# Patient Record
Sex: Female | Born: 1955 | Race: Black or African American | Hispanic: No | Marital: Single | Smoking: Never smoker
Health system: Southern US, Community
[De-identification: ages and names within clinical notes are randomized; demographics above are authoritative.]

## PROBLEM LIST (undated history)

## (undated) DIAGNOSIS — I10 Essential (primary) hypertension: Secondary | ICD-10-CM

## (undated) DIAGNOSIS — E119 Type 2 diabetes mellitus without complications: Secondary | ICD-10-CM

---

## 2019-04-28 ENCOUNTER — Emergency Department (HOSPITAL_COMMUNITY): Payer: Self-pay

## 2019-04-28 ENCOUNTER — Emergency Department (HOSPITAL_COMMUNITY)
Admission: EM | Admit: 2019-04-28 | Discharge: 2019-04-28 | Disposition: A | Discharge status: Home / Self Care | Payer: Self-pay | Attending: Emergency Medicine | Admitting: Emergency Medicine

## 2019-04-28 ENCOUNTER — Other Ambulatory Visit: Payer: Self-pay

## 2019-04-28 ENCOUNTER — Encounter (HOSPITAL_COMMUNITY): Payer: Self-pay | Admitting: Emergency Medicine

## 2019-04-28 DIAGNOSIS — I10 Essential (primary) hypertension: Secondary | ICD-10-CM | POA: Insufficient documentation

## 2019-04-28 DIAGNOSIS — E11622 Type 2 diabetes mellitus with other skin ulcer: Secondary | ICD-10-CM | POA: Insufficient documentation

## 2019-04-28 DIAGNOSIS — L97919 Non-pressure chronic ulcer of unspecified part of right lower leg with unspecified severity: Secondary | ICD-10-CM | POA: Insufficient documentation

## 2019-04-28 HISTORY — DX: Essential (primary) hypertension: I10

## 2019-04-28 HISTORY — DX: Type 2 diabetes mellitus without complications: E11.9

## 2019-04-28 LAB — BASIC METABOLIC PANEL
Anion gap: 7 (ref 5–15)
BUN: 18 mg/dL (ref 8–23)
CO2: 28 mmol/L (ref 22–32)
Calcium: 8.6 mg/dL — ABNORMAL LOW (ref 8.9–10.3)
Chloride: 105 mmol/L (ref 98–111)
Creatinine, Ser: 0.77 mg/dL (ref 0.44–1.00)
GFR calc Af Amer: >60 mL/min (ref >60)
GFR calc non Af Amer: >60 mL/min (ref >60)
Glucose, Bld: 179 mg/dL — ABNORMAL HIGH (ref 70–99)
Potassium: 4.1 mmol/L (ref 3.5–5.1)
Sodium: 140 mmol/L (ref 135–145)

## 2019-04-28 LAB — CBC WITH DIFFERENTIAL/PLATELET
Abs Immature Granulocytes: 0.02 10*3/uL (ref 0.00–0.07)
Basophils Absolute: 0 10*3/uL (ref 0.0–0.1)
Basophils Relative: 0 %
Eosinophils Absolute: 0.2 10*3/uL (ref 0.0–0.5)
Eosinophils Relative: 3 %
HCT: 38.5 % (ref 36.0–46.0)
Hemoglobin: 11.8 g/dL — ABNORMAL LOW (ref 12.0–15.0)
Immature Granulocytes: 0 %
Lymphocytes Relative: 28 %
Lymphs Abs: 1.8 10*3/uL (ref 0.7–4.0)
MCH: 26.5 pg (ref 26.0–34.0)
MCHC: 30.6 g/dL (ref 30.0–36.0)
MCV: 86.3 fL (ref 80.0–100.0)
Monocytes Absolute: 0.4 10*3/uL (ref 0.1–1.0)
Monocytes Relative: 6 %
Neutro Abs: 3.9 10*3/uL (ref 1.7–7.7)
Neutrophils Relative %: 63 %
Platelets: 123 10*3/uL — ABNORMAL LOW (ref 150–400)
RBC: 4.46 MIL/uL (ref 3.87–5.11)
RDW: 14.2 % (ref 11.5–15.5)
WBC: 6.2 10*3/uL (ref 4.0–10.5)
nRBC: 0 % (ref 0.0–0.2)

## 2019-04-28 LAB — LACTIC ACID, PLASMA: Lactic Acid, Venous: 1.3 mmol/L (ref 0.5–1.9)

## 2019-04-28 MED ORDER — CEPHALEXIN 500 MG PO CAPS: 500.0000 mg | ORAL_CAPSULE | Freq: Four times a day (QID) | ORAL | 0 refills | Status: AC

## 2019-04-28 MED ORDER — SULFAMETHOXAZOLE-TRIMETHOPRIM 800-160 MG PO TABS
1.0000 | ORAL_TABLET | Freq: Once | ORAL | Status: AC
  Administered 2019-04-28: 19:00:00 1 via ORAL
  Filled 2019-04-28: qty 1

## 2019-04-28 MED ORDER — CEPHALEXIN 500 MG PO CAPS
500.0000 mg | ORAL_CAPSULE | Freq: Once | ORAL | Status: AC
  Administered 2019-04-28: 500 mg via ORAL
  Filled 2019-04-28: qty 1

## 2019-04-28 MED ORDER — SULFAMETHOXAZOLE-TRIMETHOPRIM 800-160 MG PO TABS: 1.0000 | ORAL_TABLET | Freq: Two times a day (BID) | ORAL | 0 refills | Status: AC

## 2019-04-28 NOTE — Discharge Instructions (Signed)
Take Bactrim and Keflex as prescribed.  Please follow-up with a primary care provider or wound care clinic to continue and closely monitor your diabetic ulcer.  Return immediately for new or worsening symptoms or concerns, such as increased redness, swelling, fevers, vomiting or any concerns at all.

## 2019-04-28 NOTE — ED Triage Notes (Signed)
Pt reports that she has had diabetic ulcer on right leg for a while and was getting better until she went in the ocean. Reports since Monday had drainage.

## 2019-04-28 NOTE — ED Provider Notes (Signed)
[facility]-EMERGENCY DEPT Provider Note   CSN: [id number] Arrival date & time: 04/28/19  1341     History Chief Complaint  Patient presents with  . Wound Infection    [name] is a 64 y.o. female.  HPI     64 year old female, the PMH of HTN, diabetes, presents with a chronic wound to her right lower leg.  Patient states for the last 7 months she has had a wound to the lateral aspect of the right lower leg.  She notes that she was followed by wound care who debrided the wound regularly.  However approximately a week ago she moved up to [place] from [name].  She states she then went [place] the ocean approximately a week ago and got in the water.  Since then she has had discoloration of her leg wounds with associated foul odor.  Patient was seen at the [facility] and sent to the ED for further evaluation.  Patient has been applying gentamicin cream to affected area.  She denies any surrounding redness, increased peripheral edema.  She denies any fevers, chills.   Past Medical History:  Diagnosis Date  . Diabetes mellitus without complication (HCC)   . Hypertension     There are no problems to display for this patient.   History reviewed. No pertinent surgical history.   OB History   No obstetric history on file.     No family history on file.  Social History   Tobacco Use  . Smoking status: Not on file  Substance Use Topics  . Alcohol use: Not on file  . Drug use: Not on file    Home Medications Prior to Admission medications   Not on File    Allergies    Patient has no known allergies.  Review of Systems   Review of Systems  Constitutional: Negative for chills and fever.  Respiratory: Negative for shortness of breath.   Cardiovascular: Negative for chest pain.  Gastrointestinal: Negative for abdominal pain, nausea and vomiting.  Skin: Positive for wound.  All other systems reviewed and are negative.   Physical  Exam Updated Vital Signs BP (!) 172/77   Pulse 78   Temp 98 F (36.7 C) (Oral)   Resp 18   SpO2 97%   Physical Exam Vitals and nursing note reviewed.  Constitutional:      Appearance: She is well-developed.  HENT:     Head: Normocephalic and atraumatic.  Eyes:     Conjunctiva/sclera: Conjunctivae normal.  Cardiovascular:     Rate and Rhythm: Normal rate and regular rhythm.     Heart sounds: Normal heart sounds. No murmur.  Pulmonary:     Effort: Pulmonary effort is normal. No respiratory distress.     Breath sounds: Normal breath sounds. No wheezing or rales.  Abdominal:     General: Bowel sounds are normal. There is no distension.     Palpations: Abdomen is soft.     Tenderness: There is no abdominal tenderness.  Musculoskeletal:        General: No tenderness or deformity. Normal range of motion.     Cervical back: Neck supple.  Skin:    General: Skin is warm and dry.     Findings: Wound present.       Neurological:     Mental Status: She is alert and oriented to person, place, and time.  Psychiatric:        Behavior: Behavior normal.  ED Results / Procedures / Treatments   Labs (all labs ordered are listed, but only abnormal results are displayed) Labs Reviewed  CBC WITH DIFFERENTIAL/PLATELET  BASIC METABOLIC PANEL  LACTIC ACID, PLASMA  LACTIC ACID, PLASMA    EKG None  Radiology No results found.  Procedures Procedures (including critical care time)  Medications Ordered in ED Medications - No data to display  ED Course  I have reviewed the triage vital signs and the nursing notes.  Pertinent labs & imaging results that were available during my care of the patient were reviewed by me and considered in my medical decision making (see chart for details).    MDM Rules/Calculators/A&P                      Patient presents with acute on chronic diabetic ulcer.  She does note a foul odor.  There was one area of eschar.  Will trial antibiotics.   However I discussed with patient that she needs to be closely monitored by wound care.  No evidence of sepsis, no fevers, no vomiting.  No elevation of white count, lactic acid is normal.  X-ray shows no evidence of osteomyelitis.  Patient agreeable with plan for discharge and close monitoring outpatient.  She is ready and stable for discharge.   At this time there does not appear to be any evidence of an acute emergency medical condition and the patient appears stable for discharge with appropriate outpatient follow up.Diagnosis was discussed with patient who verbalizes understanding and is agreeable to discharge. Pt case discussed with Dr. Vanita Panda who agrees with my plan.   Final Clinical Impression(s) / ED Diagnoses Final diagnoses:  None    Rx / DC Orders ED Discharge Orders    None       Rachel Moulds 04/28/19 2318    Carmin Muskrat, MD 05/02/19 [phone]

## 2019-05-03 ENCOUNTER — Other Ambulatory Visit: Payer: Self-pay

## 2019-05-03 ENCOUNTER — Other Ambulatory Visit (HOSPITAL_COMMUNITY)
Admission: RE | Admit: 2019-05-03 | Discharge: 2019-05-03 | Disposition: A | Discharge status: Home / Self Care | Payer: Self-pay | Source: Other Acute Inpatient Hospital | Attending: Internal Medicine | Admitting: Internal Medicine

## 2019-05-03 ENCOUNTER — Encounter (HOSPITAL_BASED_OUTPATIENT_CLINIC_OR_DEPARTMENT_OTHER): Payer: Self-pay | Attending: Physician Assistant | Admitting: Internal Medicine

## 2019-05-03 ENCOUNTER — Other Ambulatory Visit (HOSPITAL_BASED_OUTPATIENT_CLINIC_OR_DEPARTMENT_OTHER): Payer: Self-pay | Admitting: Internal Medicine

## 2019-05-03 DIAGNOSIS — L97211 Non-pressure chronic ulcer of right calf limited to breakdown of skin: Secondary | ICD-10-CM | POA: Insufficient documentation

## 2019-05-03 DIAGNOSIS — E11622 Type 2 diabetes mellitus with other skin ulcer: Secondary | ICD-10-CM | POA: Insufficient documentation

## 2019-05-03 DIAGNOSIS — I10 Essential (primary) hypertension: Secondary | ICD-10-CM | POA: Insufficient documentation

## 2019-05-03 DIAGNOSIS — I87321 Chronic venous hypertension (idiopathic) with inflammation of right lower extremity: Secondary | ICD-10-CM | POA: Insufficient documentation

## 2019-05-03 DIAGNOSIS — L03115 Cellulitis of right lower limb: Secondary | ICD-10-CM | POA: Insufficient documentation

## 2019-05-03 NOTE — Progress Notes (Signed)
[place], [place] ([id number]) Visit Report for 05/03/2019 Biopsy Details Patient Name: Date of Service: [place] 05/03/2019 9:00 AM Medical Record [id number] Patient Account Number: [id number] Date of Birth/Sex: Treating RN: 14-Sep-1955 (64 y.o. Freddy FinnerF) Epps, Carrie Primary Care Provider: SYSTEM, PCP Other Clinician: Referring Provider: Treating Provider/Extender:Lanina Larranaga, Lamar SprinklesMichael Weeks in Treatment: 0 Biopsy Performed for: Wound #1 Right, Lateral Lower Leg Performed By: Physician Maxwell Caulobson, Markelle Najarian G., MD Tissue Punch: Yes Number of Specimens Taken: 2 Specimen Sent To Pathology: Yes Level of Consciousness (Pre-procedure): Awake and Alert Pre-procedure Verification/Time-Out Yes - 09:30 Taken: Pain Control: Lidocaine Injectable Lidocaine Percent: 1% Instrument: Forceps Bleeding: Moderate Hemostasis Achieved: Silver Nitrate Procedural Pain: 6 Post Procedural Pain: 2 Response to Treatment: Procedure was tolerated well Level of Consciousness (Post-procedure): Awake and Alert Post Procedure Diagnosis Same as Pre-procedure Electronic Signature(s) Signed: 05/03/2019 5:28:23 PM By: Yevonne PaxEpps, Carrie RN Signed: 05/03/2019 5:41:23 PM By: Baltazar Najjarobson, Stephannie Broner MD Entered By: Yevonne PaxEpps, Carrie on 05/03/2019 13:10:53 -------------------------------------------------------------------------------- HPI Details Patient Name: Date of Service: [place], [place] 05/03/2019 9:00 AM Medical Record [id number] Patient Account Number: [id number] Date of Birth/Sex: Treating RN: 14-Sep-1955 (64 y.o. Freddy FinnerF) Epps, Carrie Primary Care Provider: SYSTEM, PCP Other Clinician: Referring Provider: Treating Provider/Extender:Artin Mceuen, Lamar SprinklesMichael Weeks in Treatment: 0 History of Present Illness HPI Description: . The patient states she is in a lot of pain related to the wound on her right leg ADMISSION 05/03/2019 This is a 64 year old woman with type 2 diabetes who was recently moved here from the [place] area. She says  that she traumatized her right lateral leg on a bed frame about 7 months ago. This is been a gradually expanding wound since then. She says she was looked at by a wound care center in [place] although we have none of these records. She did receive a course of antibiotics while she was there. Since she has moved here she is living in a hotel she came up here for employment opportunities but she has not found anything. She was seen in an urgent care noted to have a chronic diabetic leg wound on the right also noted that she went into the ocean 2 weeks ago and the wound deteriorated with a foul odor. She had been using gentamicin cream. She was sent to the emergency room. An x-ray of the right tib-fib was negative she was put on a week worth of Keflex and Bactrim. She is using zinc oxide and Medihoney to the wound area. 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 The patient states she is in a lot of pain related to the right leg wound Past medical history hypertension and diabetes on oral agents recent hemoglobin A1c at 8.5. ABI in our clinic was 1.16 on the right Electronic Signature(s) Signed: 05/03/2019 5:41:23 PM By: Baltazar Najjarobson, Quanda Pavlicek MD Entered By: Baltazar Najjarobson, Alize Acy on 05/03/2019 10:29:41 -------------------------------------------------------------------------------- Physical Exam Details Patient Name: Date of Service: [place] 05/03/2019 9:00 AM Medical Record [id number] Patient Account Number: [id number] Date of Birth/Sex: Treating RN: 14-Sep-1955 (64 y.o. Freddy FinnerF) Epps, Carrie Primary Care Provider: SYSTEM, PCP Other Clinician: Referring Provider: Treating Provider/Extender:Joanny Dupree, Lamar SprinklesMichael Weeks in Treatment: 0 Constitutional Patient is hypertensive.. Pulse regular and within target range for patient.Marland Kitchen.  Respirations regular, non-labored and within target range.. Temperature is normal and within the target range for the patient.Marland Kitchen. Appears in no distress.  Respiratory work of breathing is normal. Bilateral breath sounds are clear and equal in all lobes with no wheezes, rales or rhonchi.. Cardiovascular Heart rhythm and rate regular, without murmur or gallop.. Pedal pulses are palpable at the dorsalis pedis and posterior tibial bilaterally. There is mild edema in her bilateral lower legs no overt varicosities no overt erythema or palpable tenderness around the wound. Integumentary (Hair, Skin) Healed areas on the anterior lower legs but the patient states these are from childhood. Neurological Sensory exam was intact. Psychiatric appears at normal baseline. Notes Wound exam; the areas on the right lateral lower calf. A substantial wound. The surface of it does not look too bad reasonably healthy looking tissue around the circumference however the area is epithelialized but hypopigmented. It is dark in certain sections. I anesthetized 3 areas for punch biopsy. To circumferentially I sent for pathology and one centrally for culture Electronic Signature(s) Signed: 05/03/2019 5:41:23 PM By: Baltazar Najjar MD Entered By: Baltazar Najjar on 05/03/2019 10:28:55 -------------------------------------------------------------------------------- Physician Orders Details Patient Name: Date of Service: [place], [name] 05/03/2019 9:00 AM Medical Record [id number] Patient Account Number: [id number] Date of Birth/Sex: Treating RN: 27-Mar-1955 (64 y.o. Freddy Finner Primary Care Provider: SYSTEM, PCP Other Clinician: Referring Provider: Treating Provider/Extender:Kortny Lirette, Lamar Sprinkles in Treatment: 0 Verbal / Phone Orders: No Diagnosis Coding Follow-up Appointments Return Appointment in 1 week. Dressing Change Frequency Do not change entire dressing for one week. Wound Cleansing May shower  and wash wound with soap and water. Primary Wound Dressing Wound #1 Right,Lateral Lower Leg Calcium Alginate with Silver Secondary Dressing Wound #1 Right,Lateral Lower Leg Dry Gauze ABD pad Edema Control 3 Layer Compression System - Right Lower Extremity Laboratory Bacteria identified in Tissue by Biopsy culture (MICRO) - non healing wound LOINC Code: 28768-1 Convenience Name: Biopsy specimen culture Bacteria identified in Tissue by Biopsy culture (MICRO) - non healing wound LOINC Code: 15726-2 Convenience Name: Biopsy specimen culture Electronic Signature(s) Signed: 05/03/2019 5:28:23 PM By: Yevonne Pax RN Signed: 05/03/2019 5:41:23 PM By: Baltazar Najjar MD Entered By: Yevonne Pax on 05/03/2019 10:13:49 -------------------------------------------------------------------------------- Problem List Details Patient Name: Date of Service: [place], [name] 05/03/2019 9:00 AM Medical Record [id number] Patient Account Number: [id number] Date of Birth/Sex: Treating RN: January 28, 1955 (64 y.o. 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/2019 No Yes breakdown of skin I87.321 Chronic venous hypertension (idiopathic) with 05/03/2019 No Yes inflammation of right lower extremity L03.115 Cellulitis of right lower limb 05/03/2019 No Yes Inactive Problems Resolved Problems Electronic Signature(s) Signed: 05/03/2019 5:41:23 PM By: Baltazar Najjar MD Entered By: Baltazar Najjar on 05/03/2019 10:22:52 -------------------------------------------------------------------------------- Progress Note Details Patient Name: Date of Service: [place], [name] 05/03/2019 9:00 AM Medical Record [id number] Patient Account Number: [id number] Date  of Birth/Sex: Treating RN: 04-04-1955 (64 y.o. Freddy Finner Primary Care Provider: SYSTEM, PCP Other Clinician: Referring Provider: Treating Provider/Extender:Consuela Widener, Lamar Sprinkles in Treatment: 0 Subjective History of Present Illness (HPI) . The patient states she is in a lot of pain related to the wound on her right leg ADMISSION 05/03/2019 This is a 64 year old woman with type 2 diabetes who was recently moved here from the [place] area. She says that she traumatized her right lateral leg on a bed frame about 7 months ago. This is been a gradually expanding wound since then. She says she was looked at by a  wound care center in [place] although we have none of these records. She did receive a course of antibiotics while she was there. Since she has moved here she is living in a hotel she came up here for employment opportunities but she has not found anything. She was seen in an urgent care noted to have a chronic diabetic leg wound on the right also noted that she went into the ocean 2 weeks ago and the wound deteriorated with a foul odor. She had been using gentamicin cream. She was sent to the emergency room. An x-ray of the right tib-fib was negative she was put on a week worth of Keflex and Bactrim. She is using zinc oxide and Medihoney to the wound area. 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 The patient states she is in a lot of pain related to the right leg wound Past medical history hypertension and diabetes on oral agents recent hemoglobin A1c at 8.5. ABI in our clinic was 1.16 on the right Patient History Information obtained from Patient. Allergies No Known Drug Allergies Family History Cancer - Father, Diabetes - Mother, Hypertension - Siblings, Kidney Disease - Siblings, Lung Disease -  Father, Stroke - Mother, No family history of Heart Disease, Hereditary Spherocytosis, Seizures, Thyroid Problems, Tuberculosis. Social History Never smoker, Marital Status - Divorced, Alcohol Use - Never, Drug Use - No History, Caffeine Use - Rarely. Medical History Eyes Denies history of Cataracts, Glaucoma, Optic Neuritis Ear/Nose/Mouth/Throat Denies history of Chronic sinus problems/congestion, Middle ear problems Hematologic/Lymphatic Denies history of Anemia, Hemophilia, Human Immunodeficiency Virus, Lymphedema, Sickle Cell Disease Respiratory Denies history of Aspiration, Asthma, Chronic Obstructive Pulmonary Disease (COPD), Pneumothorax, Sleep Apnea, Tuberculosis Cardiovascular Patient has history of Hypertension Denies history of Angina, Arrhythmia, Congestive Heart Failure, Coronary Artery Disease, Deep Vein Thrombosis, Hypotension, Myocardial Infarction, Peripheral Arterial Disease, Peripheral Venous Disease, Phlebitis, Vasculitis Gastrointestinal Denies history of Cirrhosis , Colitis, Crohnoos, Hepatitis A, Hepatitis B, Hepatitis C Endocrine Patient has history of Type II Diabetes Genitourinary Denies history of End Stage Renal Disease Immunological Denies history of Lupus Erythematosus, Raynaudoos, Scleroderma Integumentary (Skin) Denies history of History of Burn Musculoskeletal Denies history of Gout, Rheumatoid Arthritis, Osteoarthritis, Osteomyelitis Neurologic Patient has history of Neuropathy - right leg and foot Denies history of Dementia, Quadriplegia, Paraplegia, Seizure Disorder Oncologic Denies history of Received Chemotherapy, Received Radiation Psychiatric Denies history of Anorexia/bulimia, Confinement Anxiety Patient is treated with Controlled Diet, Oral Agents. Blood sugar is tested. Review of Systems (ROS) Constitutional Symptoms (General Health) Denies complaints or symptoms of Fatigue, Fever, Chills, Marked Weight Change. Eyes Complains or  has symptoms of Glasses / Contacts - reading glasses. Denies complaints or symptoms of Dry Eyes, Vision Changes. Ear/Nose/Mouth/Throat Denies complaints or symptoms of Chronic sinus problems or rhinitis. Respiratory Denies complaints or symptoms of Chronic or frequent coughs, Shortness of Breath. Cardiovascular Denies complaints or symptoms of Chest pain. Gastrointestinal Denies complaints or symptoms of Frequent diarrhea, Nausea, Vomiting. Genitourinary Denies complaints or symptoms of Frequent urination. Integumentary (Skin) Complains or has symptoms of Wounds - currently right leg.. Musculoskeletal Denies complaints or symptoms of Muscle Pain, Muscle Weakness. Neurologic Denies complaints or symptoms of Numbness/parasthesias. Psychiatric Denies complaints or symptoms of Claustrophobia, Suicidal. Objective Constitutional Patient is hypertensive.. Pulse regular and within target range for patient.Marland Kitchen Respirations  regular, non-labored and within target range.. Temperature is normal and within the target range for the patient.Marland Kitchen Appears in no distress. Vitals Time Taken: 9:00 AM, Height: 69 in, Source: Stated, Weight: 249 lbs, Source: Stated, BMI: 36.8, Temperature: 98.4 F, Pulse: 64 bpm, Respiratory Rate: 20 breaths/min, Blood Pressure: 165/74 mmHg, Capillary Blood Glucose: 122 mg/dl. Respiratory work of breathing is normal. Bilateral breath sounds are clear and equal in all lobes with no wheezes, rales or rhonchi.. Cardiovascular Heart rhythm and rate regular, without murmur or gallop.. Pedal pulses are palpable at the dorsalis pedis and posterior tibial bilaterally. There is mild edema in her bilateral lower legs no overt varicosities no overt erythema or palpable tenderness around the wound. Neurological Sensory exam was intact. Psychiatric appears at normal baseline. General Notes: Wound exam; the areas on the right lateral lower calf. A substantial wound. The surface of it  does not look too bad reasonably healthy looking tissue around the circumference however the area is epithelialized but hypopigmented. It is dark in certain sections. I anesthetized 3 areas for punch biopsy. To circumferentially I sent for pathology and one centrally for culture Integumentary (Hair, Skin) Healed areas on the anterior lower legs but the patient states these are from childhood. Wound #1 status is Open. Original cause of wound was Trauma. The wound is located on the Right,Lateral Lower Leg. The wound measures 13cm length x 10.3cm width x 0.2cm depth; 105.165cm^2 area and 21.033cm^3 volume. There is Fat Layer (Subcutaneous Tissue) Exposed exposed. There is no tunneling or undermining noted. There is a large amount of serosanguineous drainage noted. The wound margin is distinct with the outline attached to the wound base. There is large (67-100%) red granulation within the wound bed. There is a small (1-33%) amount of necrotic tissue within the wound bed including Adherent Slough. 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 Dressing Change Frequency: Do not change entire dressing for one week. Wound Cleansing: May shower and wash wound with soap and water. Primary Wound Dressing: Wound #1 Right,Lateral Lower Leg: Calcium Alginate with Silver Secondary Dressing: Wound #1 Right,Lateral Lower Leg: Dry Gauze ABD pad Edema Control: 3 Layer Compression System - Right Lower Extremity Laboratory ordered were: Biopsy specimen culture, right lower leg - non healing wound, Biopsy specimen culture, right lower leg - non healing wound 1. The exact cause of this wound is not completely clear. This may be mostly venous although the evidence for this is  less than overwhelming. I wondered about a neutrophilic dermatosis. She tells me she does not have chronic renal failure. She is now on her second course of antibiotics but I am not certain that this was all a primary infection. 2. The patient was in the ocean 2 weeks ago and the wound apparently deteriorated based on a visit to the ER on 4/22 nevertheless I do not see that is the primary etiology here as well. Things seem to have gotten better however on the Keflex and Bactrim. 3. I sent to circumferential biopsies for pathology and 1 for deep tissue culture 4. Primary dressing at silver alginate with 3 layer compression 5. The patient does not have insurance she is not eligible for home health. We will need to leave the wrap on all week. We will see what the culture and pathology shows before additional treatment  or cultures I spent 45 minutes in review of this patient's records, face-to-face evaluation and preparation of this record Electronic Signature(s) Signed: 05/03/2019 5:41:23 PM By: Baltazar Najjar MD Entered By: Baltazar Najjar on 05/03/2019 10:32:29 -------------------------------------------------------------------------------- HxROS Details Patient Name: Date of Service: [place], [name] 05/03/2019 9:00 AM Medical Record [id number] Patient Account Number: [id number] Date of Birth/Sex: Treating RN: 16-Jun-1955 (64 y.o. Freddy Finner Primary Care Provider: SYSTEM, PCP Other Clinician: Referring Provider: Treating Provider/Extender:Kunaal Walkins, Lamar Sprinkles in Treatment: 0 Information Obtained From Patient Constitutional Symptoms (General Health) Complaints and Symptoms: Negative for: Fatigue; Fever; Chills; Marked Weight Change Eyes Complaints and Symptoms: Positive for: Glasses / Contacts - reading glasses Negative for: Dry Eyes; Vision Changes Medical History: Negative for: Cataracts; Glaucoma; Optic Neuritis Ear/Nose/Mouth/Throat Complaints and Symptoms: Negative  for: Chronic sinus problems or rhinitis Medical History: Negative for: Chronic sinus problems/congestion; Middle ear problems Respiratory Complaints and Symptoms: Negative for: Chronic or frequent coughs; Shortness of Breath Medical History: Negative for: Aspiration; Asthma; Chronic Obstructive Pulmonary Disease (COPD); Pneumothorax; Sleep Apnea; Tuberculosis Cardiovascular Complaints and Symptoms: Negative for: Chest pain Medical History: Positive for: Hypertension Negative for: Angina; Arrhythmia; Congestive Heart Failure; Coronary Artery Disease; Deep Vein Thrombosis; Hypotension; Myocardial Infarction; Peripheral Arterial Disease; Peripheral Venous Disease; Phlebitis; Vasculitis Gastrointestinal Complaints and Symptoms: Negative for: Frequent diarrhea; Nausea; Vomiting Medical History: Negative for: Cirrhosis ; Colitis; Crohns; Hepatitis A; Hepatitis B; Hepatitis C Genitourinary Complaints and Symptoms: Negative for: Frequent urination Medical History: Negative for: End Stage Renal Disease Integumentary (Skin) Complaints and Symptoms: Positive for: Wounds - currently right leg. Medical History: Negative for: History of Burn Musculoskeletal Complaints and Symptoms: Negative for: Muscle Pain; Muscle Weakness Medical History: Negative for: Gout; Rheumatoid Arthritis; Osteoarthritis; Osteomyelitis Neurologic Complaints and Symptoms: Negative for: Numbness/parasthesias Medical History: Positive for: Neuropathy - right leg and foot Negative for: Dementia; Quadriplegia; Paraplegia; Seizure Disorder Psychiatric Complaints and Symptoms: Negative for: Claustrophobia; Suicidal Medical History: Negative for: Anorexia/bulimia; Confinement Anxiety Hematologic/Lymphatic Medical History: Negative for: Anemia; Hemophilia; Human Immunodeficiency Virus; Lymphedema; Sickle Cell Disease Endocrine Medical History: Positive for: Type II Diabetes Time with diabetes: 4 years Treated  with: Oral agents, Diet Blood sugar tested every day: Yes Tested : every other day Immunological Medical History: Negative for: Lupus Erythematosus; Raynauds; Scleroderma Negative for: Lupus Erythematosus; Raynauds; Scleroderma Oncologic Medical History: Negative for: Received Chemotherapy; Received Radiation Immunizations Pneumococcal Vaccine: Received Pneumococcal Vaccination: No Implantable Devices No devices added Family and Social History Cancer: Yes - Father; Diabetes: Yes - Mother; Heart Disease: No; Hereditary Spherocytosis: No; Hypertension: Yes - Siblings; Kidney Disease: Yes - Siblings; Lung Disease: Yes - Father; Seizures: No; Stroke: Yes - Mother; Thyroid Problems: No; Tuberculosis: No; Never smoker; Marital Status - Divorced; Alcohol Use: Never; Drug Use: No History; Caffeine Use: Rarely; Financial Concerns: No; Food, Clothing or Shelter Needs: No; Support System Lacking: No; Transportation Concerns: No Electronic Signature(s) Signed: 05/03/2019 5:27:53 PM By: Shawn Stall Signed: 05/03/2019 5:28:23 PM By: Yevonne Pax RN Signed: 05/03/2019 5:41:23 PM By: Baltazar Najjar MD Entered By: Shawn Stall on 05/03/2019 09:09:14 -------------------------------------------------------------------------------- SuperBill Details Patient Name: Date of Service: [place], [name] 05/03/2019 Medical Record Number:[id number] Patient Account Number: [id number] Date of Birth/Sex: Treating RN: [birth year] (64 y.o. 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 L03.115 Cellulitis of right lower limb Facility  Procedures CPT4: Code [id number] le IC  L Description: 213 - WOUND CARE VISIT-LEV 3 EST PT 104-Punch biopsy of skin (including simple closure, when performed) single sion D-10 Diagnosis Description 97.211 Non-pressure chronic ulcer of right calf limited to breakdown of sk Modifier Quantity: 25 1 1  in Physician Procedures CPT4 Code Description: 99204 - WC PHYS LEVEL 4 - NEW PT ICD-10 Diagnosis Description L97.211 Non-pressure chronic ulcer of right calf limited to breakdown o I87.321 Chronic venous hypertension (idiopathic) with inflammation of r L03.115  Cellulitis of right lower limb Modifier Quantity: 25 1 f skin ight lower extremity CPT4 Code Description: 11104 Punch biopsy of skin (including simple closure, when performed) single lesion ICD-10 Diagnosis Description L97.211 Non-pressure chronic ulcer of right calf limited to breakdo Modifier Quantity: 1 wn of skin Electronic Signature(s) Signed: 05/03/2019 5:28:23 PM By: 05/05/2019 RN Signed: 05/03/2019 5:41:23 PM By: 05/05/2019 MD Entered By: Baltazar Najjar on 05/03/2019 17:03:53

## 2019-05-03 NOTE — Progress Notes (Signed)
Tenenbaum, [name] ([id number]) Visit Report for 05/03/2019 Abuse/Suicide Risk Screen Details Patient Name: Date of Service: [name], [name] 05/03/2019 9:00 AM Medical Record [id number] Patient Account Number: [id number] Date of Birth/Sex: Treating RN: Sep 02, 1955 (64 y.o. Orvan Falconer Primary Care Shi Blankenship: SYSTEM, PCP Other Clinician: Referring Luismario Coston: Treating Mazie Fencl/Extender:Robson, Esperanza Richters in Treatment: 0 Abuse/Suicide Risk Screen Items Answer ABUSE RISK SCREEN: Has anyone close to you tried to hurt or harm you recentlyo No Do you feel uncomfortable with anyone in your familyo No Has anyone forced you do things that you didnt want to doo No Electronic Signature(s) Signed: 05/03/2019 5:27:53 PM By: Deon Pilling Signed: 05/03/2019 5:28:23 PM By: Carlene Coria RN Entered By: Deon Pilling on 05/03/2019 09:02:37 -------------------------------------------------------------------------------- Activities of Daily Living Details Patient Name: Date of Service: [name] 05/03/2019 9:00 AM Medical Record [id number] Patient Account Number: [id number] Date of Birth/Sex: Treating RN: May 10, 1955 (64 y.o. Orvan Falconer Primary Care Cherilynn Schomburg: SYSTEM, PCP Other Clinician: Referring Ranesha Val: Treating Oseph Imburgia/Extender:Robson, Esperanza Richters in Treatment: 0 Activities of Daily Living Items Answer Activities of Daily Living (Please select one for each item) Drive Automobile Completely Able Take Medications Completely Able Use Telephone Completely Able Care for Appearance Completely Able Use Toilet Completely Able Bath / Shower Completely Able Dress Self Completely Able Feed Self Completely Able Walk Completely Able Get In / Out Bed Completely Able Housework Completely Able Prepare Meals Completely Rives Completely Able Shop for Self Completely Able Electronic Signature(s) Signed: 05/03/2019 5:27:53 PM By: Deon Pilling Signed: 05/03/2019  5:28:23 PM By: Carlene Coria RN Entered By: Deon Pilling on 05/03/2019 09:02:53 -------------------------------------------------------------------------------- Education Screening Details Patient Name: Date of Service: [name] 05/03/2019 9:00 AM Medical Record [id number] Patient Account Number: [id number] Date of Birth/Sex: Treating RN: 1955-02-18 (64 y.o. Orvan Falconer Primary Care Shenise Wolgamott: SYSTEM, PCP Other Clinician: Referring Calene Paradiso: Treating Kenyona Rena/Extender:Robson, Esperanza Richters in Treatment: 0 Primary Learner Assessed: Patient Learning Preferences/Education Level/Primary Language Learning Preference: Explanation, Demonstration, Printed Material Highest Education Level: College or Above Preferred Language: Diplomatic Services operational officer Language Barrier: No Translator Needed: No Memory Deficit: No Emotional Barrier: No Cultural/Religious Beliefs Affecting Medical Care: No Physical Barrier Impaired Vision: Yes Glasses, reading Impaired Hearing: No Decreased Hand dexterity: No Knowledge/Comprehension Knowledge Level: High Comprehension Level: High Ability to understand written High instructions: Ability to understand verbal High instructions: Motivation Anxiety Level: Calm Cooperation: Cooperative Education Importance: Acknowledges Need Interest in Health Problems: Asks Questions Perception: Coherent Willingness to Engage in Self- High Management Activities: Readiness to Engage in Self- High Management Activities: Electronic Signature(s) Signed: 05/03/2019 5:27:53 PM By: Deon Pilling Signed: 05/03/2019 5:28:23 PM By: Carlene Coria RN Entered By: Deon Pilling on 05/03/2019 09:03:23 -------------------------------------------------------------------------------- Fall Risk Assessment Details Patient Name: Date of Service: [name] 05/03/2019 9:00 AM Medical Record [id number] Patient Account Number: [id number] Date of  Birth/Sex: Treating RN: 1955/06/20 (64 y.o. Orvan Falconer Primary Care Logon Uttech: SYSTEM, PCP Other Clinician: Referring Porcha Deblanc: Treating Kaiea Esselman/Extender:Robson, Esperanza Richters in Treatment: 0 Fall Risk Assessment Items Have you had 2 or more falls in the last 12 monthso 0 No Have you had any fall that resulted in injury in the last 12 monthso 0 No FALLS RISK SCREEN History of falling - immediate or within 3 months 0 No Secondary diagnosis (Do you have 2 or more medical diagnoseso) 0 No Ambulatory aid None/bed rest/wheelchair/nurse 0 Yes Crutches/cane/walker 0 No Furniture 0 No Intravenous therapy Access/Saline/Heparin Lock 0 No Weak (short steps with or without shuffle, stooped but able to lift head 0 No while  walking, may seek support from furniture) Impaired (short steps with shuffle, may have difficulty arising from chair, 0 No head down, impaired balance) Mental Status Oriented to own ability 0 Yes Overestimates or forgets limitations 0 No Risk Level: Low Risk Score: 0 Electronic Signature(s) Signed: 05/03/2019 5:27:53 PM By: Shawn Stall Signed: 05/03/2019 5:28:23 PM By: Yevonne Pax RN Entered By: Shawn Stall on 05/03/2019 09:03:33 -------------------------------------------------------------------------------- Foot Assessment Details Patient Name: Date of Service: [name] 05/03/2019 9:00 AM Medical Record [id number] Patient Account Number: [id number] Date of Birth/Sex: Treating RN: December 07, 1955 (64 y.o. Freddy Finner Primary Care Terrian Ridlon: SYSTEM, PCP Other Clinician: Referring Kahlin Mark: Treating Frieda Arnall/Extender:Robson, Lamar Sprinkles in Treatment: 0 Foot Assessment Items Site Locations + = Sensation present, - = Sensation absent, C = Callus, U = Ulcer R = Redness, W = Warmth, M = Maceration, PU = Pre-ulcerative lesion F = Fissure, S = Swelling, D = Dryness Assessment Right: Left: Other Deformity: No No Prior Foot Ulcer: No No Prior  Amputation: No No Charcot Joint: No No Ambulatory Status: Ambulatory Without Help Gait: Steady Electronic Signature(s) Signed: 05/03/2019 5:27:53 PM By: Shawn Stall Signed: 05/03/2019 5:28:23 PM By: Yevonne Pax RN Entered By: Shawn Stall on 05/03/2019 09:14:50 -------------------------------------------------------------------------------- Nutrition Risk Screening Details Patient Name: Date of Service: Happe, [name] 05/03/2019 9:00 AM Medical Record [id number] Patient Account Number: [id number] Date of Birth/Sex: Treating RN: 05/03/1955 (64 y.o. Freddy Finner Primary Care Ilyaas Musto: SYSTEM, PCP Other Clinician: Referring Tesla Bochicchio: Treating Broc Caspers/Extender:Robson, Lamar Sprinkles in Treatment: 0 Height (in): Weight (lbs): Body Mass Index (BMI): Nutrition Risk Screening Items Score Screening NUTRITION RISK SCREEN: I have an illness or condition that made me change the kind and/or amount 2 Yes of food I eat I eat fewer than two meals per day 0 No I eat few fruits and vegetables, or milk products 0 No I have three or more drinks of beer, liquor or wine almost every day 0 No I have tooth or mouth problems that make it hard for me to eat 0 No I don't always have enough money to buy the food I need 0 No I eat alone most of the time 0 No I take three or more different prescribed or over-the-counter drugs a day 1 Yes 0 No Without wanting to, I have lost or gained 10 pounds in the last six months I am not always physically able to shop, cook and/or feed myself 0 No Nutrition Protocols Good Risk Protocol Provide education on elevated blood sugars and Moderate Risk Protocol 0 impact on wound healing, as applicable High Risk Proctocol Risk Level: Moderate Risk Score: 3 Electronic Signature(s) Signed: 05/03/2019 5:27:53 PM By: Shawn Stall Signed: 05/03/2019 5:28:23 PM By: Yevonne Pax RN Entered By: Shawn Stall on 05/03/2019 09:03:43

## 2019-05-03 NOTE — Progress Notes (Signed)
Dildine, [name] ([id number]) Visit Report for 05/03/2019 Allergy List Details Patient Name: Date of Service: [name], [name] 05/03/2019 9:00 AM Medical Record [id number] Patient Account Number: [id number] Date of Birth/Sex: Treating RN: 1955/03/17 (65 y.o. Freddy Finner Primary Care Reene Harlacher: SYSTEM, PCP Other Clinician: Referring Constantin Hillery: Treating Greer Wainright/Extender:Robson, Lamar Sprinkles in Treatment: 0 Allergies Active Allergies No Known Drug Allergies Allergy Notes Electronic Signature(s) Signed: 05/03/2019 5:27:53 PM By: Shawn Stall Entered By: Shawn Stall on 05/03/2019 09:02:30 -------------------------------------------------------------------------------- Arrival Information Details Patient Name: Date of Service: [name], [name] 05/03/2019 9:00 AM Medical Record [id number] Patient Account Number: [id number] Date of Birth/Sex: Treating RN: 31-Oct-1955 (64 y.o. Freddy Finner Primary Care Harmonee Tozer: SYSTEM, PCP Other Clinician: Referring Aerion Bagdasarian: Treating Ishanvi Mcquitty/Extender:Robson, Lamar Sprinkles in Treatment: 0 Visit Information Patient Arrived: Ambulatory Arrival Time: 08:57 Accompanied By: self Transfer Assistance: None Patient Identification Verified: Yes Secondary Verification Process Completed: Yes Patient Requires Transmission-Based No Precautions: Patient Has Alerts: Yes Electronic Signature(s) Signed: 05/03/2019 5:27:53 PM By: Shawn Stall Entered By: Shawn Stall on 05/03/2019 09:01:27 -------------------------------------------------------------------------------- Clinic Level of Care Assessment Details Patient Name: Date of Service: [name], [name] 05/03/2019 9:00 AM Medical Record [id number] Patient Account Number: [id number] Date of Birth/Sex: Treating RN: [birth year] (64 y.o. Freddy Finner Primary Care Pranay Hilbun: SYSTEM, PCP Other Clinician: Referring Kayshawn Ozburn: Treating Janene Yousuf/Extender:Robson, Lamar Sprinkles in  Treatment: 0 Clinic Level of Care Assessment Items TOOL 1 Quantity Score X - Use when EandM and Procedure is performed on INITIAL visit 1 0 ASSESSMENTS - Nursing Assessment / Reassessment X - General Physical Exam (combine w/ comprehensive assessment (listed just below) 1 20 1 20  when performed on new pt. evals) X - Comprehensive Assessment (HX, ROS, Risk Assessments, Wounds Hx, etc.) 1 25 ASSESSMENTS - Wound and Skin Assessment / Reassessment []  - Dermatologic / Skin Assessment (not related to wound area) 0 ASSESSMENTS - Ostomy and/or Continence Assessment and Care []  - Incontinence Assessment and Management 0 []  - Ostomy Care Assessment and Management (repouching, etc.) 0 PROCESS - Coordination of Care X - Simple Patient / Family Education for ongoing care 1 15 []  - Complex (extensive) Patient / Family Education for ongoing care 0 X - Staff obtains , Records, Test Results / Process Orders 1 10 []  - Staff telephones HHA, Nursing Homes / Clarify orders / etc 0 []  - Routine Transfer to another Facility (non-emergent condition) 0 []  - Routine Hospital Admission (non-emergent condition) 0 X - New Admissions / / Ordering NPWT, Apligraf, etc. 1 15 []  - Emergency Hospital Admission (emergent condition) 0 PROCESS - Special Needs []  - Pediatric / Minor Patient Management 0 []  - Isolation Patient Management 0 []  - Hearing / Language / Visual special needs 0 []  - Assessment of Community assistance (transportation, D/C planning, etc.) 0 []  - Additional assistance / Altered mentation 0 []  - Support Surface(s) Assessment (bed, cushion, seat, etc.) 0 INTERVENTIONS - Miscellaneous []  - External ear exam 0 []  - Patient Transfer (multiple staff / / Similar devices) 0 []  - Simple Staple / Suture removal (25 or less) 0 []  - Complex Staple / Suture removal (26 or more) 0 []  - Hypo/Hyperglycemic Management (do not [name] if billed separately) 0 X - Ankle /  Brachial Index (ABI) - do not [name] if billed separately 1 15 Has the patient been seen at the hospital within the last three years: Yes Total Score: 100 Level Of Care: New/Established - Level 3 Electronic Signature(s) Signed: 05/03/2019 5:28:23 PM By: RN Entered By: Chiropractor on  05/03/2019 17:03:28 -------------------------------------------------------------------------------- Compression Therapy Details Patient Name: Date of Service: [name], [name] 05/03/2019 9:00 AM Medical Record [id number] Patient Account Number: [id number] Date of Birth/Sex: Treating RN: May 13, 1955 (64 y.o. Freddy Finner Primary Care Kenzlei Runions: SYSTEM, PCP Other Clinician: Referring Trigo Winterbottom: Treating Myranda Pavone/Extender:Robson, Lamar Sprinkles in Treatment: 0 Compression Therapy Performed for Wound Wound #1 Right,Lateral Lower Leg Assessment: Performed By: Clinician Yevonne Pax, RN Compression Type: Three Layer Post Procedure Diagnosis Same as Pre-procedure Electronic Signature(s) Signed: 05/03/2019 5:28:23 PM By: Yevonne Pax RN Entered By: Yevonne Pax on 05/03/2019 13:09:41 -------------------------------------------------------------------------------- Encounter Discharge Information Details Patient Name: Date of Service: [name], [name] 05/03/2019 9:00 AM Medical Record [id number] Patient Account Number: [id number] Date of Birth/Sex: Treating RN: Mar 16, 1955 (64 y.o. Harvest Dark Primary Care Tahmid Stonehocker: SYSTEM, PCP Other Clinician: Referring Quade Ramirez: Treating Demetria Lightsey/Extender:Robson, Lamar Sprinkles in Treatment: 0 Encounter Discharge Information Items Discharge Condition: Stable Ambulatory Status: Ambulatory Discharge Destination: Home Transportation: Private Auto Accompanied By: self Schedule Follow-up Appointment: Yes Clinical Summary of Care: Patient Declined Electronic Signature(s) Signed: 05/03/2019 5:26:58 PM By: Cherylin Mylar Entered By:  Cherylin Mylar on 05/03/2019 10:31:33 -------------------------------------------------------------------------------- Lower Extremity Assessment Details Patient Name: Date of Service: [name], [name] 05/03/2019 9:00 AM Medical Record [id number] Patient Account Number: [id number] Date of Birth/Sex: Treating RN: 05/17/1955 (64 y.o. Freddy Finner Primary Care Oletta Buehring: SYSTEM, PCP Other Clinician: Referring Elsbeth Yearick: Treating Paddy Walthall/Extender:Robson, Lamar Sprinkles in Treatment: 0 Edema Assessment Assessed: [Left: No] [Right: Yes] Edema: [Left: Ye] [Right: s] Calf Left: Right: Point of Measurement: 33 cm From Medial Instep cm 41 cm Ankle Left: Right: Point of Measurement: 7 cm From Medial Instep cm 26 cm Vascular Assessment Pulses: Dorsalis Pedis Palpable: [Right:Yes] Doppler Audible: [Right:Yes] Posterior Tibial Palpable: [Right:Yes] Doppler Audible: [Right:Yes] Blood Pressure: Brachial: [Right:190] Ankle: [Right:Dorsalis Pedis: 220] Ankle Brachial Index: [Right:1.16] Electronic Signature(s) Signed: 05/03/2019 5:27:53 PM By: Shawn Stall Signed: 05/03/2019 5:28:23 PM By: Yevonne Pax RN Entered By: Shawn Stall on 05/03/2019 09:25:12 -------------------------------------------------------------------------------- Multi Wound Chart Details Patient Name: Date of Service: [name], [name] 05/03/2019 9:00 AM Medical Record [id number] Patient Account Number: [id number] Date of Birth/Sex: Treating RN: 1955-10-07 (64 y.o. Freddy Finner Primary Care Larrie Lucia: SYSTEM, PCP Other Clinician: Referring Darly Fails: Treating Genaro Bekker/Extender:Robson, Lamar Sprinkles in Treatment: 0 Vital Signs Height(in): 69 Capillary Blood 122 Weight(lbs): 249 Glucose(mg/dl): Body Mass Index(BMI): 37 Pulse(bpm): 64 Temperature(F): 98.4 Blood Pressure(mmHg):165/74 Respiratory 20 Rate(breaths/min): Photos: [1:No Photos] [N/A:N/A] Wound Location: [1:Right, Lateral Lower  Leg] [N/A:N/A] Wounding Event: [1:Trauma] [N/A:N/A] Primary Etiology: [1:Venous Leg Ulcer] [N/A:N/A] Secondary Etiology: [1:Diabetic Wound/Ulcer of the N/A Lower Extremity] Comorbid History: [1:Hypertension, Type II Diabetes, Neuropathy] [N/A:N/A] Date Acquired: [1:10/03/2018] [N/A:N/A] Weeks of Treatment: [1:0] [N/A:N/A] Wound Status: [1:Open] [N/A:N/A] Measurements L x W x D 13x10.3x0.2 [N/A:N/A] (cm) Area (cm) : [1:105.165] [N/A:N/A] Volume (cm) : [1:21.033] [N/A:N/A] Classification: [1:Full Thickness Without Exposed Support Structures] [N/A:N/A] Exudate Amount: [1:Large] [N/A:N/A] Exudate Type: [1:Serosanguineous] [N/A:N/A] Exudate Color: [1:red, brown] [N/A:N/A] Wound Margin: [1:Distinct, outline attached N/A] Granulation Amount: [1:Large (67-100%)] [N/A:N/A] Granulation Quality: [1:Red] [N/A:N/A] Necrotic Amount: [1:Small (1-33%)] [N/A:N/A] Exposed Structures: [1:Fat Layer (Subcutaneous N/A Tissue) Exposed: Yes Fascia: No Tendon: No Muscle: No Joint: No Bone: No Small (1-33%)] [N/A:N/A] Treatment Notes Electronic Signature(s) Signed: 05/03/2019 5:28:23 PM By: Yevonne Pax RN Signed: 05/03/2019 5:41:23 PM By: Baltazar Najjar MD Entered By: Baltazar Najjar on 05/03/2019 10:23:13 -------------------------------------------------------------------------------- Multi-Disciplinary Care Plan Details Patient Name: Date of Service: [name], [name] 05/03/2019 9:00 AM Medical Record [id number] Patient Account Number: [id number] Date of Birth/Sex: Treating RN: 14-Nov-1955 (64 y.o. Freddy Finner Primary Care Richar Dunklee:  SYSTEM, PCP Other Clinician: Referring Kasin Tonkinson: Treating Darrly Loberg/Extender:Robson, Esperanza Richters in Treatment: 0 Active Inactive Nutrition Nursing Diagnoses: Potential for alteratiion in Nutrition/Potential for imbalanced nutrition Goals: Patient/caregiver will maintain therapeutic glucose control Date Initiated: 05/03/2019 Target Resolution Date:  06/03/2019 Goal Status: Active Interventions: Assess HgA1c results as ordered upon admission and as needed Assess patient nutrition upon admission and as needed per policy Notes: Wound/Skin Impairment Nursing Diagnoses: Knowledge deficit related to ulceration/compromised skin integrity Goals: Patient/caregiver will verbalize understanding of skin care regimen Date Initiated: 05/03/2019 Target Resolution Date: 06/03/2019 Goal Status: Active Ulcer/skin breakdown will have a volume reduction of 30% by week 4 Date Initiated: 05/03/2019 Target Resolution Date: 06/03/2019 Goal Status: Active Interventions: Assess patient/caregiver ability to obtain necessary supplies Assess patient/caregiver ability to perform ulcer/skin care regimen upon admission and as needed Assess ulceration(s) every visit Notes: Electronic Signature(s) Signed: 05/03/2019 5:28:23 PM By: Carlene Coria RN Entered By: Carlene Coria on 05/03/2019 10:10:30 -------------------------------------------------------------------------------- Pain Assessment Details Patient Name: Date of Service: [name], [name] 05/03/2019 9:00 AM Medical Record [id number] Patient Account Number: [id number] Date of Birth/Sex: Treating RN: 10-Feb-1955 (64 y.o. Orvan Falconer Primary Care Tenley Winward: SYSTEM, PCP Other Clinician: Referring Imanie Darrow: Treating Aria Jarrard/Extender:Robson, Esperanza Richters in Treatment: 0 Active Problems Location of Pain Severity and Description of Pain Patient Has Paino Yes Site Locations Pain Location: Pain in Ulcers Rate the pain. Current Pain Level: 5 Worst Pain Level: 10 Least Pain Level: 0 Tolerable Pain Level: 8 Pain Management and Medication Current Pain Management: Medication: No Cold Application: No Rest: No Massage: No Activity: No T.E.N.S.: No Heat Application: No Leg drop or elevation: No Is the Current Pain Management Adequate: Adequate How does your wound impact your activities of daily  livingo Sleep: No Bathing: No Appetite: No Relationship With Others: No Bladder Continence: No Emotions: No Bowel Continence: No Work: No Toileting: No Drive: No Dressing: No Hobbies: No Electronic Signature(s) Signed: 05/03/2019 5:27:53 PM By: Deon Pilling Signed: 05/03/2019 5:28:23 PM By: Carlene Coria RN Entered By: Deon Pilling on 05/03/2019 09:04:08 -------------------------------------------------------------------------------- Patient/Caregiver Education Details Patient Name: Date of Service: [name], [name] 4/27/2021andnbsp9:00 AM Medical Record [id number] Patient Account Number: [id number] Date of Birth/Gender: Oct 31, 1955 (64 y.o. F) Treating RN: Carlene Coria Primary Care Physician: SYSTEM, PCP Other Clinician: Referring Physician: Treating Physician/Extender:Robson, Esperanza Richters in Treatment: 0 Education Assessment Education Provided To: Patient Education Topics Provided Wound/Skin Impairment: Methods: Explain/Verbal Responses: State content correctly Electronic Signature(s) Signed: 05/03/2019 5:28:23 PM By: Carlene Coria RN Entered By: Carlene Coria on 05/03/2019 10:10:50 -------------------------------------------------------------------------------- Wound Assessment Details Patient Name: Date of Service: [name], [name] 05/03/2019 9:00 AM Medical Record [id number] Patient Account Number: [id number] Date of Birth/Sex: Treating RN: 23-Oct-1955 (64 y.o. Orvan Falconer Primary Care Odel Schmid: SYSTEM, PCP Other Clinician: Referring Jonny Longino: Treating Zurri Rudden/Extender:Robson, Esperanza Richters in Treatment: 0 Wound Status Wound Number: 1 Primary Etiology: Venous Leg Ulcer Wound Location: Right, Lateral Lower Leg Secondary Diabetic Wound/Ulcer of the Lower Wounding Event: Trauma Etiology: Extremity Date Acquired: 10/03/2018 Wound Status: Open Weeks Of Treatment:0 Comorbid History:Hypertension, Type II Diabetes, Clustered Wound: No  Neuropathy Wound Measurements Length: (cm) 13 % Reductio Width: (cm) 10.3 % Reductio Depth: (cm) 0.2 Epithelial Area: (cm) 105.165 Tunneling Volume: (cm) 21.033 Undermini Wound Description Classification: Full Thickness Without Exposed Support Foul Odo Structures Slough/F Wound Distinct, outline attached Margin: Exudate Large Amount: Exudate Type:Serosanguineous Exudate red, brown Color: Wound Bed Granulation Amount: Large (67-100%) Granulation Quality: Red Fascia E Necrotic Amount: Small (1-33%) Fat Laye Necrotic Quality: Adherent Slough Tendon E Muscle E Joint Ex Bone  Exp r After Cleansing: No ibrino Yes Exposed Structure xposed: No r (Subcutaneous Tissue) Exposed: Yes xposed: No xposed: No posed: No osed: No n in Area: n in Volume: ization: Small (1-33%) : No ng: No Treatment Notes Wound #1 (Right, Lateral Lower Leg) 1. Cleanse With Wound Cleanser Soap and water 2. Periwound Care Moisturizing lotion 3. Primary Dressing Applied Calcium Alginate Ag 4. Secondary Dressing ABD Pad 6. Support Layer Applied 3 layer compression wrap Notes netting Electronic Signature(s) Signed: 05/03/2019 5:27:53 PM By: Shawn Stall Signed: 05/03/2019 5:28:23 PM By: Yevonne Pax RN Entered By: Shawn Stall on 05/03/2019 09:26:14 -------------------------------------------------------------------------------- Vitals Details Patient Name: Date of Service: [name], [name] 05/03/2019 9:00 AM Medical Record [id number] Patient Account Number: [id number] Date of Birth/Sex: Treating RN: 10-Dec-1955 (64 y.o. Freddy Finner Primary Care Seng Fouts: SYSTEM, PCP Other Clinician: Referring Reighlynn Swiney: Treating Diontay Rosencrans/Extender:Robson, Lamar Sprinkles in Treatment: 0 Vital Signs Time Taken: 09:00 Temperature (F): 98.4 Height (in): 69 Pulse (bpm): 64 Source: Stated Respiratory Rate (breaths/min): 20 Weight (lbs): 249 Blood Pressure (mmHg): 165/74 Source:  Stated Capillary Blood Glucose (mg/dl): 127 Body Mass Index (BMI): 36.8 Reference Range: 80 - 120 mg / dl Electronic Signature(s) Signed: 05/03/2019 5:27:53 PM By: Shawn Stall Entered By: Shawn Stall on 05/03/2019 09:04:25

## 2019-05-05 ENCOUNTER — Encounter (HOSPITAL_BASED_OUTPATIENT_CLINIC_OR_DEPARTMENT_OTHER): Payer: Self-pay | Admitting: Internal Medicine

## 2019-05-05 NOTE — Progress Notes (Signed)
[place], [name] ([id number]) Visit Report for 05/05/2019 SuperBill Details Patient Name: Date of Service: Hal Hope 05/05/2019 Medical Record Number: [id number] Patient Account Number: [id number] Date of Birth/Sex: Treating RN: 1955-12-16 (64 y.o. Wynelle Link Primary Care Provider: SYSTEM, PCP Other Clinician: Referring Provider: Treating Provider/Extender: Valentino Saxon in Treatment: 0 Diagnosis Coding ICD-10 Codes Code Description [phone] Non-pressure chronic ulcer of right calf limited to breakdown of skin I87.321 Chronic venous hypertension (idiopathic) with inflammation of right lower extremity L03.115 Cellulitis of right lower limb Facility Procedures CPT4 Code Description Modifier Quantity [id number] (Facility Use Only) [phone] - APPLY MULTLAY COMPRS LWR RT LEG 1 Electronic Signature(s) Signed: 05/05/2019 5:31:56 PM By: Zandra Abts RN, BSN Signed: 05/05/2019 5:41:26 PM By: Baltazar Najjar MD Entered By: Zandra Abts on 05/05/2019 16:17:44

## 2019-05-08 LAB — AEROBIC/ANAEROBIC CULTURE W GRAM STAIN (SURGICAL/DEEP WOUND): Gram Stain: NONE SEEN

## 2019-05-10 ENCOUNTER — Encounter (HOSPITAL_BASED_OUTPATIENT_CLINIC_OR_DEPARTMENT_OTHER): Payer: Self-pay | Attending: Internal Medicine | Admitting: Internal Medicine

## 2019-05-10 DIAGNOSIS — I87321 Chronic venous hypertension (idiopathic) with inflammation of right lower extremity: Secondary | ICD-10-CM | POA: Insufficient documentation

## 2019-05-10 DIAGNOSIS — Z7984 Long term (current) use of oral hypoglycemic drugs: Secondary | ICD-10-CM | POA: Insufficient documentation

## 2019-05-10 DIAGNOSIS — L97211 Non-pressure chronic ulcer of right calf limited to breakdown of skin: Secondary | ICD-10-CM | POA: Insufficient documentation

## 2019-05-10 DIAGNOSIS — L03115 Cellulitis of right lower limb: Secondary | ICD-10-CM | POA: Insufficient documentation

## 2019-05-10 DIAGNOSIS — E11621 Type 2 diabetes mellitus with foot ulcer: Secondary | ICD-10-CM | POA: Insufficient documentation

## 2019-05-10 DIAGNOSIS — I10 Essential (primary) hypertension: Secondary | ICD-10-CM | POA: Insufficient documentation

## 2019-05-11 ENCOUNTER — Encounter (HOSPITAL_BASED_OUTPATIENT_CLINIC_OR_DEPARTMENT_OTHER): Payer: Self-pay | Admitting: Physician Assistant

## 2019-05-11 NOTE — Progress Notes (Signed)
[place], [place] ([id number]) Visit Report for 05/10/2019 Debridement Details Patient Name: Date of Service: [name] 05/10/2019 8:45 A M Medical Record Number: [id number] Patient Account Number: [id number] Date of Birth/Sex: Treating RN: 27-Nov-1955 (64 y.o. [name] Primary Care Provider: SYSTEM, PCP Other Clinician: Referring Provider: Treating Provider/Extender: Cheree Ditto in Treatment: 1 Debridement Performed for Assessment: Wound #1 Right,Lateral Lower Leg Performed By: Physician Ricard Dillon., MD Debridement Type: Debridement Severity of Tissue Pre Debridement: Fat layer exposed Level of Consciousness (Pre-procedure): Awake and Alert Pre-procedure Verification/Time Out Yes - 09:31 Taken: Start Time: 09:31 Pain Control: Lidocaine 5% topical ointment T Area Debrided (L x W): otal 12 (cm) x 1 (cm) = 12 (cm) Tissue and other material debrided: Slough, Subcutaneous, Skin: Dermis , Skin: Epidermis, Slough Level: Skin/Subcutaneous Tissue Debridement Description: Excisional Instrument: Curette Bleeding: Moderate Hemostasis Achieved: Pressure End Time: 09:36 Procedural Pain: 4 Post Procedural Pain: 3 Response to Treatment: Procedure was tolerated well Level of Consciousness (Post- Awake and Alert procedure): Post Debridement Measurements of Total Wound Length: (cm) 12.7 Width: (cm) 7 Depth: (cm) 0.1 Volume: (cm) 6.982 Character of Wound/Ulcer Post Debridement: Improved Severity of Tissue Post Debridement: Fat layer exposed Post Procedure Diagnosis Same as Pre-procedure Electronic Signature(s) Signed: 05/10/2019 5:25:34 PM By: Linton Ham MD Signed: 05/11/2019 4:37:47 PM By: Carlene Coria RN Entered By: Linton Ham on 05/10/2019 09:55:56 -------------------------------------------------------------------------------- HPI Details Patient Name: Date of Service: [name], Olivia Lopez de Gutierrez 05/10/2019 8:45 A M Medical Record Number:  [id number] Patient Account Number: [id number] Date of Birth/Sex: Treating RN: October 06, 1955 (64 y.o. [name] Primary Care Provider: SYSTEM, PCP Other Clinician: Referring Provider: Treating Provider/Extender: Cheree Ditto in Treatment: 1 History of Present Illness HPI Description: . The patient states she is in a lot of pain related to the wound on her right leg ADMISSION 05/03/2019 This is a 64 year old woman with type 2 diabetes who was recently moved here from the [place] area. She says that she traumatized her right lateral leg on a bed frame about 7 months ago. This is been a gradually expanding wound since then. She says she was looked at by a wound care center in [place] although we have none of these records. She did receive a course of antibiotics while she was there. Since she has moved here she is living in a hotel she came up here for employment opportunities but she has not found anything. She was seen in an urgent care noted to have a chronic diabetic leg wound on the right also noted that she went into the ocean 2 weeks ago and the wound deteriorated with a foul odor. She had been using gentamicin cream. She was sent to the emergency room. An x-ray of the right tib-fib was negative she was put on a week worth of Keflex and Bactrim. She is using zinc oxide and Medihoney to the wound area. 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 The patient states she is in a lot of pain related to the right leg wound Past medical history hypertension and diabetes on oral agents recent hemoglobin A1c at 8.5. ABI in our clinic was 1.16 on the right 5/4; patient with a large wound on her right anterior lateral lower leg. I biopsied this last time because the  patient says this started 7 months ago with trauma  and has been expanding. The pathology was nonspecific there was no malignancy no fungus they commented on acutely inflamed and ulcerated skin compatible with a diabetic ulcer. Culture showed a few Pseudomonas unfortunately only have intermediate sensitivity to ciprofloxacin. She does not have any drug insurance and could not possibly afford it third-generation or oral cephalosporin which would have been the drug of choice Electronic Signature(s) Signed: 05/10/2019 5:25:34 PM By: Baltazar Najjarobson, Ashlynd Michna MD Entered By: Baltazar Najjarobson, Leily Capek on 05/10/2019 09:57:31 -------------------------------------------------------------------------------- Physical Exam Details Patient Name: Date of Service: [name] CoopFRA NKLIN, [name] 05/10/2019 8:45 A M Medical Record Number: [id number] Patient Account Number: [id number] Date of Birth/Sex: Treating RN: 10-10-1955 (64 y.o. [name] FinnerF) Epps, Carrie Primary Care Provider: SYSTEM, PCP Other Clinician: Referring Provider: Treating Provider/Extender: Valentino Saxonobson, Mechille Varghese Weeks in Treatment: 1 Constitutional Patient is hypertensive.. Pulse regular and within target range for patient.Marland Kitchen. Respirations regular, non-labored and within target range.. Temperature is normal and within the target range for the patient.Marland Kitchen. Appears in no distress. Notes Wound exam; the areas on the right lateral lower calf. Substantial wound 100% nonviable coverage. Very difficult debridement of the circumference of the wound she did not tolerate this well using an open curette. No evidence of surrounding infection Electronic Signature(s) Signed: 05/10/2019 5:25:34 PM By: Baltazar Najjarobson, Keshan Reha MD Entered By: Baltazar Najjarobson, Chanell Nadeau on 05/10/2019 09:58:38 -------------------------------------------------------------------------------- Physician Orders Details Patient Name: Date of Service: [name] CoopFRA NKLIN, [name] 05/10/2019 8:45 A M Medical Record Number: [id number] Patient Account Number: [id number] Date of Birth/Sex: Treating RN: 10-10-1955 (64  y.o. [name] FinnerF) Epps, Carrie Primary Care Provider: SYSTEM, PCP Other Clinician: Referring Provider: Treating Provider/Extender: Valentino Saxonobson, Lola Czerwonka Weeks in Treatment: 1 Verbal / Phone Orders: No Diagnosis Coding ICD-10 Coding Code Description L97.211 Non-pressure chronic ulcer of right calf limited to breakdown of skin I87.321 Chronic venous hypertension (idiopathic) with inflammation of right lower extremity L03.115 Cellulitis of right lower limb Follow-up Appointments Return Appointment in 1 week. Dressing Change Frequency Do not change entire dressing for one week. Wound Cleansing May shower and wash wound with soap and water. Primary Wound Dressing Wound #1 Right,Lateral Lower Leg Iodoflex Secondary Dressing Wound #1 Right,Lateral Lower Leg Dry Gauze ABD pad Edema Control 3 Layer Compression System - Right Lower Extremity Patient Medications llergies: No Known Drug Allergies A Notifications Medication Indication Start End 05/10/2019 Cipro DOSE 1 - oral 500 mg tablet - 1 tablet oral, 2 times per day times 7 days Electronic Signature(s) Signed: 05/10/2019 5:25:34 PM By: Baltazar Najjarobson, Marqui Formby MD Signed: 05/11/2019 4:37:47 PM By: Yevonne PaxEpps, Carrie RN Entered By: Yevonne PaxEpps, Carrie on 05/10/2019 09:57:51 Prescription 05/10/2019 -------------------------------------------------------------------------------- Krausz, [name] Letisia Schwalb MD Patient Name: Provider: [phone] Date of Birth: NPI#Carmon Ginsberg: [id number] Sex: DEA #: [phone] Phone #: [facility] #[facility] Patient Address: [address] [place], [place] [id number] [address] [place], [place] [address] [phone] Allergies Name Reaction Severity No Known Drug Allergies Medication Medication: Route: Strength: Form: Cipro oral 500 mg tablet Class: QUINOLONE ANTIBIOTICS Dose: Frequency / Time: Indication: 1 1 tablet oral, 2 times per day times 7  days Number of Refills: Number of Units: 0 Generic Substitution: Start Date: End Date: Administered at Facility: Substitution Permitted 05/10/2019 No Note to Pharmacy: Hand Signature: Date(s): Electronic Signature(s) Signed: 05/10/2019 5:25:34 PM By: Baltazar Najjarobson, Riggin Cuttino MD Signed: 05/11/2019 4:37:47 PM By: Yevonne PaxEpps, Carrie RN Entered By: Yevonne PaxEpps, Carrie on 05/10/2019 09:57:52 -------------------------------------------------------------------------------- Problem List Details Patient Name: Date of Service:  [name], [name] 05/10/2019 8:45 A M Medical Record Number: [id number] Patient Account Number: [id number] Date of Birth/Sex: Treating RN: August 13, 1955 (64 y.o. [name] Primary Care Provider: SYSTEM, PCP Other Clinician: Referring Provider: Treating Provider/Extender: Valentino Saxon in Treatment: 1 Active Problems ICD-10 Encounter Code Description Active Date MDM Diagnosis L97.211 Non-pressure chronic ulcer of right calf limited to breakdown of skin 05/03/2019 No Yes I87.321 Chronic venous hypertension (idiopathic) with inflammation of right lower 05/03/2019 No Yes extremity L03.115 Cellulitis of right lower limb 05/03/2019 No Yes Inactive Problems Resolved Problems Electronic Signature(s) Signed: 05/10/2019 5:25:34 PM By: Baltazar Najjar MD Entered By: Baltazar Najjar on 05/10/2019 09:55:28 -------------------------------------------------------------------------------- Progress Note Details Patient Name: Date of Service: [name], [name] 05/10/2019 8:45 A M Medical Record Number: [id number] Patient Account Number: [id number] Date of Birth/Sex: Treating RN: 26-Jan-1955 (64 y.o. [name] Primary Care Provider: SYSTEM, PCP Other Clinician: Referring Provider: Treating Provider/Extender: Valentino Saxon in Treatment: 1 Subjective History of Present Illness (HPI) . The patient states she is in a lot of pain related to the wound on her right leg  ADMISSION 05/03/2019 This is a 64 year old woman with type 2 diabetes who was recently moved here from the [place] area. She says that she traumatized her right lateral leg on a bed frame about 7 months ago. This is been a gradually expanding wound since then. She says she was looked at by a wound care center in [place] although we have none of these records. She did receive a course of antibiotics while she was there. Since she has moved here she is living in a hotel she came up here for employment opportunities but she has not found anything. She was seen in an urgent care noted to have a chronic diabetic leg wound on the right also noted that she went into the ocean 2 weeks ago and the wound deteriorated with a foul odor. She had been using gentamicin cream. She was sent to the emergency room. An x-ray of the right tib-fib was negative she was put on a week worth of Keflex and Bactrim. She is using zinc oxide and Medihoney to the wound area. 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 The patient states she is in a lot of pain related to the right leg wound Past medical history hypertension and diabetes on oral agents recent hemoglobin A1c at 8.5. ABI in our clinic was 1.16 on the right 5/4; patient with a large wound on her right anterior lateral lower leg. I biopsied this last time because the patient says this started 7 months ago with trauma and has been expanding. The pathology was nonspecific there was no malignancy no fungus they commented on acutely inflamed and ulcerated skin compatible with a diabetic ulcer. Culture showed a few Pseudomonas unfortunately only have intermediate sensitivity to ciprofloxacin. She does not have any drug insurance and could not possibly afford it third-generation or oral  cephalosporin which would have been the drug of choice Objective Constitutional Patient is hypertensive.. Pulse regular and within target range for patient.Marland Kitchen Respirations regular, non-labored and within target range.. Temperature is normal and within the target range for the patient.Marland Kitchen Appears in no distress. Vitals Time Taken: 8:53 AM, Height: 69 in, Source: Stated, Weight: 249 lbs, Source:  Stated, BMI: 36.8, Temperature: 98 F, Pulse: 57 bpm, Respiratory Rate: 18 breaths/min, Blood Pressure: 182/78 mmHg, Capillary Blood Glucose: 117 mg/dl. General Notes: glucose per pt report this am General Notes: Wound exam; the areas on the right lateral lower calf. Substantial wound 100% nonviable coverage. Very difficult debridement of the circumference of the wound she did not tolerate this well using an open curette. No evidence of surrounding infection Integumentary (Hair, Skin) Wound #1 status is Open. Original cause of wound was Trauma. The wound is located on the Right,Lateral Lower Leg. The wound measures 12.7cm length x 7cm width x 0.1cm depth; 69.822cm^2 area and 6.982cm^3 volume. There is Fat Layer (Subcutaneous Tissue) Exposed exposed. There is no tunneling or undermining noted. There is a large amount of purulent drainage noted. The wound margin is flat and intact. There is small (1-33%) red granulation within the wound bed. There is a large (67-100%) amount of necrotic tissue within the wound bed including Adherent Slough. 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 There was a Excisional Skin/Subcutaneous Tissue Debridement with a total area of  12 sq cm performed by Maxwell Caul., MD. With the following instrument(s): Curette Material removed includes Subcutaneous Tissue, Slough, Skin: Dermis, and Skin: Epidermis after achieving pain control using Lidocaine 5% topical ointment. No specimens were taken. A time out was conducted at 09:31, prior to the start of the procedure. A Moderate amount of bleeding was controlled with Pressure. The procedure was tolerated well with a pain level of 4 throughout and a pain level of 3 following the procedure. Post Debridement Measurements: 12.7cm length x 7cm width x 0.1cm depth; 6.982cm^3 volume. Character of Wound/Ulcer Post Debridement is improved. Severity of Tissue Post Debridement is: Fat layer exposed. Post procedure Diagnosis Wound #1: Same as Pre-Procedure Pre-procedure diagnosis of Wound #1 is a Venous Leg Ulcer located on the Right,Lateral Lower Leg . There was a Three Layer Compression Therapy Procedure by Yevonne Pax, RN. Post procedure Diagnosis Wound #1: Same as Pre-Procedure Plan Follow-up Appointments: Return Appointment in 1 week. Dressing Change Frequency: Do not change entire dressing for one week. Wound Cleansing: May shower and wash wound with soap and water. Primary Wound Dressing: Wound #1 Right,Lateral Lower Leg: Iodoflex Secondary Dressing: Wound #1 Right,Lateral Lower Leg: Dry Gauze ABD pad Edema Control: 3 Layer Compression System - Right Lower Extremity The following medication(s) was prescribed: Cipro oral 500 mg tablet 1 1 tablet oral, 2 times per day times 7 days starting 05/10/2019 1. Change the dressing to Iodoflex to see if we could get some further debridement 2. Unfortunately the surface here is not viable and will take a very difficult debridement. I am not sure I am going to be able to get her through this as an outpatient. May need to consider referral to plastics for outpatient but Intra-Op operative debridement. 3. She has Pseudomonas in the wound  bed. It was listed as rare but nevertheless I felt obligated to try and do something about this. She does not have any drug insurance. I gave her ciprofloxacin even though the organism was listed is only intermediately sensitive. She could not afford an oral third-generation cephalosporin Electronic Signature(s) Signed: 05/10/2019 5:25:34 PM By:  Baltazar Najjar MD Entered By: Baltazar Najjar on 05/10/2019 10:00:03 -------------------------------------------------------------------------------- SuperBill Details Patient Name: Date of Service: [name], [name] 05/10/2019 Medical Record Number: [id number] Patient Account Number: [id number] Date of Birth/Sex: Treating RN: [birth year] (64 y.o. [name] Primary Care Provider: SYSTEM, PCP Other Clinician: Referring Provider: Treating Provider/Extender: Valentino Saxon in Treatment: 1 Diagnosis Coding ICD-10 Codes Code Description [phone] Non-pressure chronic ulcer of right calf limited to breakdown of skin I87.321 Chronic venous hypertension (idiopathic) with inflammation of right lower extremity L03.115 Cellulitis of right lower limb Facility Procedures CPT4 Code: [id number] 1 Description: 1042 - DEB SUBQ TISSUE 20 SQ CM/< ICD-10 Diagnosis Description L97.211 Non-pressure chronic ulcer of right calf limited to breakdown of skin I87.321 Chronic venous hypertension (idiopathic) with inflammation of right lower extr Modifier: emity Quantity: 1 Physician Procedures : CPT4 Code Description Modifier [id number] 11042 - WC PHYS SUBQ TISS 20 SQ CM ICD-10 Diagnosis Description L97.211 Non-pressure chronic ulcer of right calf limited to breakdown of skin I87.321 Chronic venous hypertension (idiopathic) with inflammation of  right lower extremity Quantity: 1 Electronic Signature(s) Signed: 05/10/2019 5:25:34 PM By: Baltazar Najjar MD Entered By: Baltazar Najjar on 05/10/2019 10:00:19

## 2019-05-12 NOTE — Progress Notes (Signed)
[place], [name] ([id number]) Visit Report for 05/10/2019 Arrival Information Details Patient Name: Date of Service: [name] 05/10/2019 8:45 A M Medical Record Number: [id number] Patient Account Number: [id number] Date of Birth/Sex: Treating RN: [birth year] (64 y.o. [name] Primary Care Valory Wetherby: SYSTEM, PCP Other Clinician: Referring Kenley Rettinger: Treating Cace Osorto/Extender: Valentino Saxon in Treatment: 1 Visit Information History Since Last Visit Added or deleted any medications: No Patient Arrived: Ambulatory Any new allergies or adverse reactions: No Arrival Time: 08:46 Had a fall or experienced change in No Accompanied By: self activities of daily living that may affect Transfer Assistance: None risk of falls: Patient Identification Verified: Yes Signs or symptoms of abuse/neglect since last visito No Secondary Verification Process Completed: Yes Hospitalized since last visit: No Patient Requires Transmission-Based Precautions: No Implantable device outside of the clinic excluding No Patient Has Alerts: Yes cellular tissue based products placed in the center since last visit: Has Dressing in Place as Prescribed: Yes Has Compression in Place as Prescribed: Yes Pain Present Now: Yes Electronic Signature(s) Signed: 05/10/2019 5:30:50 PM By: Zenaida Deed RN, BSN Entered By: Zenaida Deed on 05/10/2019 08:51:23 -------------------------------------------------------------------------------- Compression Therapy Details Patient Name: Date of Service: [name], [name] 05/10/2019 8:45 A M Medical Record Number: [id number] Patient Account Number: [id number] Date of Birth/Sex: Treating RN: 1955/06/18 (64 y.o. [name] Primary Care Deetya Drouillard: SYSTEM, PCP Other Clinician: Referring Chelesea Weiand: Treating Attallah Ontko/Extender: Valentino Saxon in Treatment: 1 Compression Therapy Performed for Wound Assessment: Wound #1 Right,Lateral Lower Leg Performed  By: Clinician Yevonne Pax, RN Compression Type: Three Layer Post Procedure Diagnosis Same as Pre-procedure Electronic Signature(s) Signed: 05/11/2019 4:37:47 PM By: Yevonne Pax RN Entered By: Yevonne Pax on 05/10/2019 09:38:23 -------------------------------------------------------------------------------- Encounter Discharge Information Details Patient Name: Date of Service: [name], [name] 05/10/2019 8:45 A M Medical Record Number: [id number] Patient Account Number: [id number] Date of Birth/Sex: Treating RN: 21-Aug-1955 (64 y.o. [name] Primary Care Arlita Buffkin: SYSTEM, PCP Other Clinician: Referring Gail Creekmore: Treating Takeyla Million/Extender: Valentino Saxon in Treatment: 1 Encounter Discharge Information Items Post Procedure Vitals Discharge Condition: Stable Temperature (F): 98 Ambulatory Status: Ambulatory Pulse (bpm): 57 Discharge Destination: Home Respiratory Rate (breaths/min): 18 Transportation: Private Auto Blood Pressure (mmHg): 182/78 Accompanied By: alone Schedule Follow-up Appointment: Yes Clinical Summary of Care: Patient Declined Electronic Signature(s) Signed: 05/12/2019 5:16:23 PM By: Zandra Abts RN, BSN Entered By: Zandra Abts on 05/10/2019 10:19:14 -------------------------------------------------------------------------------- Lower Extremity Assessment Details Patient Name: Date of Service: [name], [name] 05/10/2019 8:45 A M Medical Record Number: [id number] Patient Account Number: [id number] Date of Birth/Sex: Treating RN: 1955-11-07 (64 y.o. [name] Primary Care Taylor Levick: SYSTEM, PCP Other Clinician: Referring Lue [place]: Treating Naveya Ellerman/Extender: Valentino Saxon in Treatment: 1 Edema Assessment Assessed: [Left: No] [Right: No] Edema: [Left: Ye] [Right: s] Calf Left: Right: Point of Measurement: 33 cm From Medial Instep cm 41.7 cm Ankle Left: Right: Point of Measurement: 7 cm From Medial Instep cm 28.5  cm Vascular Assessment Pulses: Dorsalis Pedis Palpable: [Right:Yes] Electronic Signature(s) Signed: 05/10/2019 5:30:50 PM By: Zenaida Deed RN, BSN Entered By: Zenaida Deed on 05/10/2019 09:04:23 -------------------------------------------------------------------------------- Multi Wound Chart Details Patient Name: Date of Service: [name], [name] 05/10/2019 8:45 A M Medical Record Number: [id number] Patient Account Number: [id number] Date of Birth/Sex: Treating RN: 10/29/1955 (64 y.o. [name] Primary Care Vera Furniss: SYSTEM, PCP Other Clinician: Referring Hurschel Paynter: Treating Joseth Weigel/Extender: Valentino Saxon in Treatment: 1 Vital Signs Height(in): 69 Capillary Blood Glucose(mg/dl): 762 Weight(lbs): 831 Pulse(bpm): 57 Body Mass Index(BMI):  65 Blood Pressure(mmHg): 182/78 Temperature(F): 98 Respiratory Rate(breaths/min): 18 Photos: [1:No Photos Right, Lateral Lower Leg] [N/A:N/A N/A] Wound Location: [1:Trauma] [N/A:N/A] Wounding Event: [1:Venous Leg Ulcer] [N/A:N/A] Primary Etiology: [1:Diabetic Wound/Ulcer of the Lower] [N/A:N/A] Secondary Etiology: [1:Extremity Hypertension, Type II Diabetes,] [N/A:N/A] Comorbid History: [1:Neuropathy 10/03/2018] [N/A:N/A] Date Acquired: [1:1] [N/A:N/A] Weeks of Treatment: [1:Open] [N/A:N/A] Wound Status: [1:12.7x7x0.1] [N/A:N/A] Measurements L x W x D (cm) [1:69.822] [N/A:N/A] A (cm) : rea [1:6.982] [N/A:N/A] Volume (cm) : [1:33.60%] [N/A:N/A] % Reduction in A [1:rea: 66.80%] [N/A:N/A] % Reduction in Volume: [1:Full Thickness Without Exposed] [N/A:N/A] Classification: [1:Support Structures Large] [N/A:N/A] Exudate A mount: [1:Purulent] [N/A:N/A] Exudate Type: [1:yellow, brown, green] [N/A:N/A] Exudate Color: [1:Flat and Intact] [N/A:N/A] Wound Margin: [1:Small (1-33%)] [N/A:N/A] Granulation A mount: [1:Red] [N/A:N/A] Granulation Quality: [1:Large (67-100%)] [N/A:N/A] Necrotic A mount: [1:Fat Layer (Subcutaneous  Tissue)] [N/A:N/A] Exposed Structures: [1:Exposed: Yes Fascia: No Tendon: No Muscle: No Joint: No Bone: No Small (1-33%)] [N/A:N/A] Epithelialization: [1:Debridement - Excisional] [N/A:N/A] Debridement: Pre-procedure Verification/Time Out 09:31 [N/A:N/A] Taken: [1:Lidocaine 5% topical ointment] [N/A:N/A] Pain Control: [1:Subcutaneous, Slough] [N/A:N/A] Tissue Debrided: [1:Skin/Subcutaneous Tissue] [N/A:N/A] Level: [1:12] [N/A:N/A] Debridement A (sq cm): [1:rea Curette] [N/A:N/A] Instrument: [1:Moderate] [N/A:N/A] Bleeding: [1:Pressure] [N/A:N/A] Hemostasis A chieved: [1:4] [N/A:N/A] Procedural Pain: [1:3] [N/A:N/A] Post Procedural Pain: [1:Procedure was tolerated well] [N/A:N/A] Debridement Treatment Response: [1:12.7x7x0.1] [N/A:N/A] Post Debridement Measurements L x W x D (cm) [1:6.982] [N/A:N/A] Post Debridement Volume: (cm) [1:Compression Therapy] [N/A:N/A] Procedures Performed: [1:Debridement] Treatment Notes Electronic Signature(s) Signed: 05/10/2019 5:25:34 PM By: Linton Ham MD Signed: 05/11/2019 4:37:47 PM By: Carlene Coria RN Entered By: Linton Ham on 05/10/2019 09:55:38 -------------------------------------------------------------------------------- Multi-Disciplinary Care Plan Details Patient Name: Date of Service: [name], [name] 05/10/2019 8:45 A M Medical Record Number: [id number] Patient Account Number: [id number] Date of Birth/Sex: Treating RN: 04-17-1955 (64 y.o. Orvan Falconer Primary Care Chandria Rookstool: SYSTEM, PCP Other Clinician: Referring Satcha Storlie: Treating Marlen Mollica/Extender: Cheree Ditto in Treatment: 1 Active Inactive Nutrition Nursing Diagnoses: Potential for alteratiion in Nutrition/Potential for imbalanced nutrition Goals: Patient/caregiver will maintain therapeutic glucose control Date Initiated: 05/03/2019 Target Resolution Date: 06/03/2019 Goal Status: Active Interventions: Assess HgA1c results as ordered upon admission and as  needed Assess patient nutrition upon admission and as needed per policy Notes: Wound/Skin Impairment Nursing Diagnoses: Knowledge deficit related to ulceration/compromised skin integrity Goals: Patient/caregiver will verbalize understanding of skin care regimen Date Initiated: 05/03/2019 Target Resolution Date: 06/03/2019 Goal Status: Active Ulcer/skin breakdown will have a volume reduction of 30% by week 4 Date Initiated: 05/03/2019 Target Resolution Date: 06/03/2019 Goal Status: Active Interventions: Assess patient/caregiver ability to obtain necessary supplies Assess patient/caregiver ability to perform ulcer/skin care regimen upon admission and as needed Assess ulceration(s) every visit Notes: Electronic Signature(s) Signed: 05/11/2019 4:37:47 PM By: Carlene Coria RN Entered By: Carlene Coria on 05/10/2019 09:04:12 -------------------------------------------------------------------------------- Pain Assessment Details Patient Name: Date of Service: [name], [place] 05/10/2019 8:45 A M Medical Record Number: [id number] Patient Account Number: [id number] Date of Birth/Sex: Treating RN: May 29, 1955 (64 y.o. Elam Dutch Primary Care Hodari Chuba: SYSTEM, PCP Other Clinician: Referring Margaret Staggs: Treating Berlene Dixson/Extender: Cheree Ditto in Treatment: 1 Active Problems Location of Pain Severity and Description of Pain Patient Has Paino Yes Site Locations Pain Location: Pain in Ulcers With Dressing Change: Yes Duration of the Pain. Constant / Intermittento Intermittent Rate the pain. Current Pain Level: 2 Worst Pain Level: 7 Least Pain Level: 0 Character of Pain Describe the Pain: Sharp Pain Management and Medication Current Pain Management: Medication: Yes Is the Current Pain Management Adequate: Adequate  How does your wound impact your activities of daily livingo Sleep: No Bathing: No Appetite: No Relationship With Others: No Bladder Continence:  No Emotions: No Bowel Continence: No Work: No Toileting: No Drive: No Dressing: No Hobbies: No Electronic Signature(s) Signed: 05/10/2019 5:30:50 PM By: Zenaida Deed RN, BSN Entered By: Zenaida Deed on 05/10/2019 08:56:59 -------------------------------------------------------------------------------- Patient/Caregiver Education Details Patient Name: Date of Service: [name], [name] 5/4/2021andnbsp8:45 A M Medical Record Number: [id number] Patient Account Number: [id number] Date of Birth/Gender: Treating RN: 22-Apr-1955 (64 y.o. [name] Primary Care Physician: SYSTEM, PCP Other Clinician: Referring Physician: Treating Physician/Extender: Valentino Saxon in Treatment: 1 Education Assessment Education Provided To: Patient Education Topics Provided Wound/Skin Impairment: Methods: Explain/Verbal Responses: State content correctly Electronic Signature(s) Signed: 05/11/2019 4:37:47 PM By: Yevonne Pax RN Entered By: Yevonne Pax on 05/10/2019 09:04:27 -------------------------------------------------------------------------------- Wound Assessment Details Patient Name: Date of Service: [name], [name] 05/10/2019 8:45 A M Medical Record Number: [id number] Patient Account Number: [id number] Date of Birth/Sex: Treating RN: [birth year] (64 y.o. [name] Primary Care Disha Cottam: SYSTEM, PCP Other Clinician: Referring Yi Haugan: Treating Kenzie Thoreson/Extender: Valentino Saxon in Treatment: 1 Wound Status Wound Number: 1 Primary Etiology: Venous Leg Ulcer Wound Location: Right, Lateral Lower Leg Secondary Etiology: Diabetic Wound/Ulcer of the Lower Extremity Wounding Event: Trauma Wound Status: Open Date Acquired: 10/03/2018 Comorbid History: Hypertension, Type II Diabetes, Neuropathy Weeks Of Treatment: 1 Clustered Wound: No Photos Photo Uploaded By: Benjaman Kindler on 05/11/2019 08:40:12 Wound Measurements Length: (cm) 12.7 Width: (cm)  7 Depth: (cm) 0.1 Area: (cm) 69.822 Volume: (cm) 6.982 % Reduction in Area: 33.6% % Reduction in Volume: 66.8% Epithelialization: Small (1-33%) Tunneling: No Undermining: No Wound Description Classification: Full Thickness Without Exposed Support Structures Wound Margin: Flat and Intact Exudate Amount: Large Exudate Type: Purulent Exudate Color: yellow, brown, green Foul Odor After Cleansing: No Slough/Fibrino Yes Wound Bed Granulation Amount: Small (1-33%) Exposed Structure Granulation Quality: Red Fascia Exposed: No Necrotic Amount: Large (67-100%) Fat Layer (Subcutaneous Tissue) Exposed: Yes Necrotic Quality: Adherent Slough Tendon Exposed: No Muscle Exposed: No Joint Exposed: No Bone Exposed: No Treatment Notes Wound #1 (Right, Lateral Lower Leg) 1. Cleanse With Soap and water 3. Primary Dressing Applied Iodoflex 4. Secondary Dressing ABD Pad Dry Gauze 6. Support Layer Applied 3 layer compression Cytogeneticist) Signed: 05/10/2019 5:30:50 PM By: Zenaida Deed RN, BSN Entered By: Zenaida Deed on 05/10/2019 09:06:47 -------------------------------------------------------------------------------- Vitals Details Patient Name: Date of Service: [name], [name] 05/10/2019 8:45 A M Medical Record Number: [id number] Patient Account Number: [id number] Date of Birth/Sex: Treating RN: 02-15-1955 (64 y.o. [name] Primary Care Lucyann Romano: SYSTEM, PCP Other Clinician: Referring Kerryann Allaire: Treating Zaila Crew/Extender: Valentino Saxon in Treatment: 1 Vital Signs Time Taken: 08:53 Temperature (F): 98 Height (in): 69 Pulse (bpm): 57 Source: Stated Respiratory Rate (breaths/min): 18 Weight (lbs): 249 Blood Pressure (mmHg): 182/78 Source: Stated Capillary Blood Glucose (mg/dl): 779 Body Mass Index (BMI): 36.8 Reference Range: 80 - 120 mg / dl Notes glucose per pt report this am Electronic Signature(s) Signed: 05/10/2019 5:30:50 PM  By: Zenaida Deed RN, BSN Entered By: Zenaida Deed on 05/10/2019 09:03:23

## 2019-05-17 ENCOUNTER — Other Ambulatory Visit: Payer: Self-pay

## 2019-05-17 ENCOUNTER — Emergency Department (HOSPITAL_COMMUNITY): Payer: Self-pay

## 2019-05-17 ENCOUNTER — Emergency Department (HOSPITAL_COMMUNITY)
Admission: EM | Admit: 2019-05-17 | Discharge: 2019-05-17 | Disposition: A | Discharge status: Home / Self Care | Payer: Self-pay | Attending: Emergency Medicine | Admitting: Emergency Medicine

## 2019-05-17 ENCOUNTER — Encounter (HOSPITAL_BASED_OUTPATIENT_CLINIC_OR_DEPARTMENT_OTHER): Payer: Self-pay | Admitting: Internal Medicine

## 2019-05-17 ENCOUNTER — Encounter (HOSPITAL_COMMUNITY): Payer: Self-pay | Admitting: Emergency Medicine

## 2019-05-17 DIAGNOSIS — Y998 Other external cause status: Secondary | ICD-10-CM | POA: Insufficient documentation

## 2019-05-17 DIAGNOSIS — E119 Type 2 diabetes mellitus without complications: Secondary | ICD-10-CM | POA: Insufficient documentation

## 2019-05-17 DIAGNOSIS — J3489 Other specified disorders of nose and nasal sinuses: Secondary | ICD-10-CM | POA: Insufficient documentation

## 2019-05-17 DIAGNOSIS — W010XXA Fall on same level from slipping, tripping and stumbling without subsequent striking against object, initial encounter: Secondary | ICD-10-CM | POA: Insufficient documentation

## 2019-05-17 DIAGNOSIS — M79602 Pain in left arm: Secondary | ICD-10-CM | POA: Insufficient documentation

## 2019-05-17 DIAGNOSIS — I10 Essential (primary) hypertension: Secondary | ICD-10-CM | POA: Insufficient documentation

## 2019-05-17 DIAGNOSIS — W19XXXA Unspecified fall, initial encounter: Secondary | ICD-10-CM

## 2019-05-17 DIAGNOSIS — Z79899 Other long term (current) drug therapy: Secondary | ICD-10-CM | POA: Insufficient documentation

## 2019-05-17 DIAGNOSIS — Y929 Unspecified place or not applicable: Secondary | ICD-10-CM | POA: Insufficient documentation

## 2019-05-17 DIAGNOSIS — Y9301 Activity, walking, marching and hiking: Secondary | ICD-10-CM | POA: Insufficient documentation

## 2019-05-17 DIAGNOSIS — Z7984 Long term (current) use of oral hypoglycemic drugs: Secondary | ICD-10-CM | POA: Insufficient documentation

## 2019-05-17 DIAGNOSIS — Z7982 Long term (current) use of aspirin: Secondary | ICD-10-CM | POA: Insufficient documentation

## 2019-05-17 NOTE — ED Triage Notes (Signed)
Patient here from home with complaints of fall today coming out of store. Reports hitting nose on concrete. Also reports left arm pain below elbow and above wrist.

## 2019-05-17 NOTE — Discharge Instructions (Signed)
Please follow up with your primary care provider within 5-7 days for re-evaluation of your symptoms. If you do not have a primary care provider, information for a healthcare clinic has been provided for you to make arrangements for follow up care. Please return to the emergency department for any new or worsening symptoms. ° °

## 2019-05-17 NOTE — Progress Notes (Signed)
[place], [name] ([id number]) Visit Report for 05/17/2019 HPI Details Patient Name: Date of Service: [name] 05/17/2019 9:00 A M Medical Record Number: [id number] Patient Account Number: [id number] Date of Birth/Sex: Treating RN: December 06, 1955 (64 y.o. [name] Primary Care Provider: SYSTEM, PCP Other Clinician: Referring Provider: Treating Provider/Extender: [name] in Treatment: 2 History of Present Illness HPI Description: . The patient states she is in a lot of pain related to the wound on her right leg ADMISSION 05/03/2019 This is a 64 year old woman with type 2 diabetes who was recently moved here from the [place] area. She says that she traumatized her right lateral leg on a bed frame about 7 months ago. This is been a gradually expanding wound since then. She says she was looked at by a wound care center in [place] although we have none of these records. She did receive a course of antibiotics while she was there. Since she has moved here she is living in a hotel she came up here for employment opportunities but she has not found anything. She was seen in an urgent care noted to have a chronic diabetic leg wound on the right also noted that she went into the ocean 2 weeks ago and the wound deteriorated with a foul odor. She had been using gentamicin cream. She was sent to the emergency room. An x-ray of the right tib-fib was negative she was put on a week worth of Keflex and Bactrim. She is using zinc oxide and Medihoney to the wound area. 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 The patient states she is in a lot of pain related to the right leg wound Past medical history hypertension and diabetes on oral agents recent hemoglobin A1c at 8.5. ABI in our clinic was 1.16 on the  right 5/4; patient with a large wound on her right anterior lateral lower leg. I biopsied this last time because the patient says this started 7 months ago with trauma and has been expanding. The pathology was nonspecific there was no malignancy no fungus they commented on acutely inflamed and ulcerated skin compatible with a diabetic ulcer. Culture showed a few Pseudomonas unfortunately only have intermediate sensitivity to ciprofloxacin. She does not have any drug insurance and could not possibly afford it third-generation or oral cephalosporin which would have been the drug of choice 5/11; patient with a large wound on the right anterior lateral lower leg. Most likely a venous wound. The wound has a nonviable surface. She is completing the antibiotics for a very resistant Pseudomonas I gave her last time. We have been using Iodoflex. She is complaining of a lot of pain Electronic Signature(s) Signed: 05/17/2019 6:13:26 PM By: [name] Najjar MD Entered By: [name] on 05/17/2019 09:26:54 -------------------------------------------------------------------------------- Physical Exam Details Patient Name: Date of Service: [name], [name] 05/17/2019 9:00 A M Medical Record Number: [id number] Patient Account Number: [id number] Date of Birth/Sex: Treating RN: May 08, 1955 (64 y.o. [name] Primary Care Provider: SYSTEM, PCP Other Clinician: Referring Provider: Treating Provider/Extender: [name] in Treatment: 2 Constitutional Sitting or standing Blood Pressure is within target range for patient.. Pulse regular and within target range for patient.Marland Kitchen Respirations regular, non-labored and within target range.. Temperature is normal and within the target range for  the patient.Marland Kitchen Appears in no distress. Cardiovascular Pedal pulses were palpable. Notes Wound exam; the area on the right lateral lower calf. Substantial wound. under illumination almost 100% nonviable surface  except for a very small area of healthy tissue superiorly. Electronic Signature(s) Signed: 05/17/2019 6:13:26 PM By: [name] Ham MD Entered By: [name] on 05/17/2019 09:28:09 -------------------------------------------------------------------------------- Physician Orders Details Patient Name: Date of Service: [name], [name] 05/17/2019 9:00 [place] Record Number: [id number] Patient Account Number: [id number] Date of Birth/Sex: Treating RN: [birth year] (64 y.o. [name] Primary Care Provider: SYSTEM, PCP Other Clinician: Referring Provider: Treating Provider/Extender: [name] in Treatment: 2 Verbal / Phone Orders: No Diagnosis Coding ICD-10 Coding Code Description I34.742 Non-pressure chronic ulcer of right calf limited to breakdown of skin I87.321 Chronic venous hypertension (idiopathic) with inflammation of right lower extremity L03.115 Cellulitis of right lower limb Follow-up Appointments Return Appointment in 1 week. Dressing Change Frequency Do not change entire dressing for one week. Wound Cleansing May shower and wash wound with soap and water. Primary Wound Dressing Wound #1 Right,Lateral Lower Leg Hydrogel Cutimed Sorbact Secondary Dressing Wound #1 Right,Lateral Lower Leg Dry Gauze ABD pad Edema Control 3 Layer Compression [name] - Right Lower Extremity Electronic Signature(s) Signed: 05/17/2019 5:53:12 PM By: [name] Coria RN Signed: 05/17/2019 6:13:26 PM By: [name] Ham MD Entered By: [name] on 05/17/2019 09:20:53 -------------------------------------------------------------------------------- Problem List Details Patient Name: Date of Service: [name], [name] 05/17/2019 9:00 [place] Record Number: [id number] Patient Account Number: [id number] Date of Birth/Sex: Treating RN: [birth year] (64 y.o. [name] Primary Care Provider: SYSTEM, PCP Other Clinician: Referring Provider: Treating  Provider/Extender: [name] in Treatment: 2 Active Problems ICD-10 Encounter Code Description Active Date MDM Diagnosis L97.211 Non-pressure chronic ulcer of right calf limited to breakdown of skin 05/03/2019 No Yes I87.321 Chronic venous hypertension (idiopathic) with inflammation of right lower 05/03/2019 No Yes extremity L03.115 Cellulitis of right lower limb 05/03/2019 No Yes Inactive Problems Resolved Problems Electronic Signature(s) Signed: 05/17/2019 6:13:26 PM By: [name] Ham MD Entered By: [name] on 05/17/2019 09:25:40 -------------------------------------------------------------------------------- Progress Note Details Patient Name: Date of Service: [name], [name] 05/17/2019 9:00 [place] Record Number: [id number] Patient Account Number: [id number] Date of Birth/Sex: Treating RN: 1955-06-27 (64 y.o. [name] Primary Care Provider: SYSTEM, PCP Other Clinician: Referring Provider: Treating Provider/Extender: [name] in Treatment: 2 Subjective History of Present Illness (HPI) . The patient states she is in a lot of pain related to the wound on her right leg ADMISSION 05/03/2019 This is a 64 year old woman with type 2 diabetes who was recently moved here from the [place] area. She says that she traumatized her right lateral leg on a bed frame about 7 months ago. This is been a gradually expanding wound since then. She says she was looked at by a wound care center in [place] although we have none of these records. She did receive a course of antibiotics while she was there. Since she has moved here she is living in a hotel she came up here for employment opportunities but she has not found anything. She was seen in an urgent care noted to have a chronic diabetic leg wound on the right also noted that she went into the ocean 2 weeks ago and the wound deteriorated with a foul odor. She had been using gentamicin cream. She was sent  to the emergency room. An x-ray of the right tib-fib was negative she was  put on a week worth of Keflex and Bactrim. She is using zinc oxide and Medihoney to the wound area. 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 The patient states she is in a lot of pain related to the right leg wound Past medical history hypertension and diabetes on oral agents recent hemoglobin A1c at 8.5. ABI in our clinic was 1.16 on the right 5/4; patient with a large wound on her right anterior lateral lower leg. I biopsied this last time because the patient says this started 7 months ago with trauma and has been expanding. The pathology was nonspecific there was no malignancy no fungus they commented on acutely inflamed and ulcerated skin compatible with a diabetic ulcer. Culture showed a few Pseudomonas unfortunately only have intermediate sensitivity to ciprofloxacin. She does not have any drug insurance and could not possibly afford it third-generation or oral cephalosporin which would have been the drug of choice 5/11; patient with a large wound on the right anterior lateral lower leg. Most likely a venous wound. The wound has a nonviable surface. She is completing the antibiotics for a very resistant Pseudomonas I gave her last time. We have been using Iodoflex. She is complaining of a lot of pain Objective Constitutional Sitting or standing Blood Pressure is within target range for patient.. Pulse regular and within target range for patient.Marland Kitchen Respirations regular, non-labored and within target range.. Temperature is normal and within the target range for the patient.Marland Kitchen Appears in no distress. Vitals Time Taken: 8:55 AM, Height: 69 in, Weight: 249 lbs, BMI: 36.8, Temperature: 97.8 F, Pulse: 71 bpm, Respiratory Rate: 18 breaths/min,  Capillary Blood Glucose: 120 mg/dl. Cardiovascular Pedal pulses were palpable. General Notes: Wound exam; the area on the right lateral lower calf. Substantial wound. under illumination almost 100% nonviable surface except for a very small area of healthy tissue superiorly. Integumentary (Hair, Skin) Wound #1 status is Open. Original cause of wound was Trauma. The wound is located on the Right,Lateral Lower Leg. The wound measures 13cm length x 9.2cm width x 0.1cm depth; 93.934cm^2 area and 9.393cm^3 volume. There is Fat Layer (Subcutaneous Tissue) Exposed exposed. There is no tunneling or undermining noted. There is a large amount of purulent drainage noted. The wound margin is flat and intact. There is small (1-33%) red granulation within the wound bed. There is a large (67-100%) amount of necrotic tissue within the wound bed including Adherent Slough. 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 There was a Three Layer Compression Therapy Procedure by Yevonne Pax, RN. Post procedure Diagnosis Wound #1: Same as Pre-Procedure Plan Follow-up Appointments: Return Appointment in 1 week. Dressing Change Frequency: Do not change entire dressing for one week. Wound Cleansing: May shower and wash wound with soap and water. Primary Wound Dressing: Wound #1 Right,Lateral Lower Leg: Hydrogel Cutimed Sorbact Secondary Dressing: Wound #1 Right,Lateral Lower Leg: Dry Gauze ABD pad Edema Control: 3 Layer Compression [name] - Right Lower Extremity 1. We still do not have any information from the wound care center in [place] on  this patient. I was hoping to find out if she had had reflux studies. 2. The  biopsy I did was negative. This is probably a venous wound 3. I changed her to Sorbact hydrogel today. 4. She tells me she does not want debridement although that is exactly what she needs. If she does not allow mechanical debridement here the only option will be to send her to plastic surgery to see if they be willing to take her to the OR. She did not seem to happy with this idea either as I presented it to her. 5. She had a few Pseudomonas on the deep tissue culture I did very resistant I did treat her with ciprofloxacin even though there was intermediate sensitivity. I do not see any evidence of infection right now so no further antibiotics. [The Pseudomonas was even resistant to gentamicin] Electronic Signature(s) Signed: 05/17/2019 6:13:26 PM By: [name] Najjar MD Entered By: [name] on 05/17/2019 09:30:09 -------------------------------------------------------------------------------- SuperBill Details Patient Name: Date of Service: [name], [name] 05/17/2019 Medical Record Number: [id number] Patient Account Number: [id number] Date of Birth/Sex: Treating RN: [birth year] (64 y.o. [name] Primary Care Provider: SYSTEM, PCP Other Clinician: Referring Provider: Treating Provider/Extender: [name] in Treatment: 2 Diagnosis Coding ICD-10 Codes Code Description [id number] Non-pressure chronic ulcer of right calf limited to breakdown of skin I87.321 Chronic venous hypertension (idiopathic) with inflammation of right lower extremity L03.115 Cellulitis of right lower limb Facility Procedures CPT4 Code: [id number] Description: (Facility Use Only) [phone] - APPLY MULTLAY COMPRS LWR RT LEG Modifier: Quantity: 1 Physician Procedures : CPT4 Code Description Modifier [id number] - WC PHYS LEVEL 4 - EST PT ICD-10 Diagnosis Description L97.211 Non-pressure chronic ulcer of right calf limited to breakdown of skin I87.321 Chronic venous hypertension (idiopathic) with  inflammation of  right lower extremity L03.115 Cellulitis of right lower limb Quantity: 1 Electronic Signature(s) Signed: 05/17/2019 6:13:26 PM By: [name] Najjar MD Entered By: [name] on 05/17/2019 09:30:34

## 2019-05-17 NOTE — ED Provider Notes (Signed)
[facility]-EMERGENCY DEPT Provider Note   CSN: [id number] Arrival date & time: 05/17/19  1130     History Chief Complaint  Patient presents with   Fall   Facial Injury   Arm Injury    [name] is a 64 y.o. female.  HPI   64 year old female with a history of diabetes, hypertension, presents emergency department today for evaluation of a fall.  States she was walking when her foot got caught underneath her causing her to trip and fall forward.  States she hit her face on the ground.  She does not think that she lost consciousness.  She is complaining of pain to her nose.  She denies any headache, dizziness, lightheadedness, vomiting.  She is not anticoagulated.  She is also complaining of pain to the left elbow.  She denies other injuries.  Past Medical History:  Diagnosis Date   Diabetes mellitus without complication (HCC)    Hypertension     There are no problems to display for this patient.   History reviewed. No pertinent surgical history.   OB History   No obstetric history on file.     No family history on file.  Social History   Tobacco Use   Smoking status: Never Smoker   Smokeless tobacco: Never Used  Substance Use Topics   Alcohol use: Never   Drug use: Never    Home Medications Prior to Admission medications   Medication Sig Start Date End Date Taking? Authorizing Provider  aspirin EC 81 MG tablet Take 81 mg by mouth daily.    [provider]  losartan (COZAAR) 25 MG tablet Take 25 mg by mouth daily.    [provider]  metFORMIN (GLUCOPHAGE) 500 MG tablet Take 500 mg by mouth in the morning and at bedtime.    [provider]  OVER THE COUNTER MEDICATION Herbal supplement    [provider]    Allergies    Patient has no known allergies.  Review of Systems   Review of Systems  Constitutional: Negative for fever.  Eyes: Negative for visual disturbance.    Respiratory: Negative for shortness of breath.   Cardiovascular: Negative for chest pain.  Gastrointestinal: Negative for abdominal pain and vomiting.  Genitourinary: Negative for flank pain.  Musculoskeletal: Negative for back pain and neck pain.       Left arm pain  Skin: Negative for wound.  Neurological: Negative for dizziness, weakness, light-headedness, numbness and headaches.       Head/face injury, no loc  All other systems reviewed and are negative.   Physical Exam Updated Vital Signs BP (!) 171/93 (BP Location: Right Arm)    Pulse 69    Temp 98.6 F (37 C)    Resp 18    SpO2 99%   Physical Exam Vitals and nursing note reviewed.  Constitutional:      General: She is not in acute distress.    Appearance: She is well-developed.  HENT:     Head: Normocephalic.     Nose:     Comments: TTP over the bridge of the nose Eyes:     Extraocular Movements: Extraocular movements intact.     Conjunctiva/sclera: Conjunctivae normal.     Pupils: Pupils are equal, round, and reactive to light.  Cardiovascular:     Rate and Rhythm: Normal rate and regular rhythm.     Heart sounds: No murmur.  Pulmonary:     Effort: Pulmonary effort is normal.  Breath sounds: Normal breath sounds.  Abdominal:     Palpations: Abdomen is soft.  Musculoskeletal:     Cervical back: Neck supple.     Comments: No TTP to the cervical, thoracic, or lumbar spine  Skin:    General: Skin is warm and dry.  Neurological:     Mental Status: She is alert.     Comments: Mental Status:  Alert, thought content appropriate, able to give a coherent history. Speech fluent without evidence of aphasia. Able to follow 2 step commands without difficulty.  Cranial Nerves:  II: pupils equal, round, reactive to light III,IV, VI: ptosis not present, extra-ocular motions intact bilaterally  V,VII: smile symmetric, facial light touch sensation equal VIII: hearing grossly normal to voice  X: uvula elevates  symmetrically  XI: bilateral shoulder shrug symmetric and strong XII: midline tongue extension without fassiculations Motor:  Normal tone. 5/5 strength of BUE and BLE major muscle groups including strong and equal grip strength and dorsiflexion/plantar flexion Sensory: light touch normal in all extremities.      ED Results / Procedures / Treatments   Labs (all labs ordered are listed, but only abnormal results are displayed) Labs Reviewed - No data to display  EKG None  Radiology DG Forearm Left  Result Date: 05/17/2019 CLINICAL DATA:  Proximal left forearm pain after fall EXAM: LEFT FOREARM - 2 VIEW COMPARISON:  None. FINDINGS: There is no evidence of fracture or other focal bone lesions. Mild arthropathy of the elbow and wrist joints. No elbow joint effusion is seen. Prominent enthesopathic changes at the triceps tendon insertion. Enthesopathic changes at the medial greater than lateral epicondyles. Soft tissues are unremarkable. IMPRESSION: No acute osseous abnormality of the left forearm. Electronically Signed   By: Davina Poke D.O.   On: 05/17/2019 12:42   CT Head Wo Contrast  Result Date: 05/17/2019 CLINICAL DATA:  Fall, headache EXAM: CT HEAD WITHOUT CONTRAST TECHNIQUE: Contiguous axial images were obtained from the base of the skull through the vertex without intravenous contrast. COMPARISON:  None. FINDINGS: Brain: Chronic small vessel disease throughout the deep white matter. No acute intracranial abnormality. Specifically, no hemorrhage, hydrocephalus, mass lesion, acute infarction, or significant intracranial injury. Vascular: No hyperdense vessel or unexpected calcification. Skull: No acute calvarial abnormality. Sinuses/Orbits: Visualized paranasal sinuses and mastoids clear. Orbital soft tissues unremarkable. Other: None IMPRESSION: Chronic small vessel disease.  No acute intracranial abnormality. Electronically Signed   By: Rolm Baptise M.D.   On: 05/17/2019 13:30   CT  Maxillofacial Wo Contrast  Result Date: 05/17/2019 CLINICAL DATA:  Fall, hit nose EXAM: CT MAXILLOFACIAL WITHOUT CONTRAST TECHNIQUE: Multidetector CT imaging of the maxillofacial structures was performed. Multiplanar CT image reconstructions were also generated. COMPARISON:  None. FINDINGS: Osseous: No fracture or mandibular dislocation. No destructive process. Orbits: No orbital fracture.  Globes are intact. Sinuses: Clear Soft tissues: No acute findings Limited intracranial: No acute findings IMPRESSION: No facial or orbital fracture. Electronically Signed   By: Rolm Baptise M.D.   On: 05/17/2019 13:31    Procedures Procedures (including critical care time)  Medications Ordered in ED Medications - No data to display  ED Course  I have reviewed the triage vital signs and the nursing notes.  Pertinent labs & imaging results that were available during my care of the patient were reviewed by me and considered in my medical decision making (see chart for details).    MDM Rules/Calculators/A&P  64 year old female presenting for evaluation after mechanical fall that occurred prior to arrival.  Hit her nose and is complaining of left arm pain.  No LOC.  Normal neuro exam.  CT head/maxillofacial did not show any acute intracranial injury, skull fracture.  No facial or orbital fracture noted either. X-ray of the left forearm does not show any evidence of acute fracture  Work-up reassuring here.  Advised Tylenol/ibuprofen for pain.  Advised on PCP follow-up and strict return precautions.  She voiced understanding plan reasons to return.  All questions answered.  Patient stable for discharge.  Final Clinical Impression(s) / ED Diagnoses Final diagnoses:  Fall, initial encounter  Nose pain  Left arm pain    Rx / DC Orders ED Discharge Orders    None       Karrie Meres, PA-C 05/17/19 1403    Cathren Laine, MD 05/17/19 1547

## 2019-05-18 NOTE — Progress Notes (Signed)
[place], [name] ([id number]) Visit Report for 05/17/2019 Arrival Information Details Patient Name: Date of Service: [name] 05/17/2019 9:00 A M Medical Record Number: [id number] Patient Account Number: [id number] Date of Birth/Sex: Treating RN: 01-11-1955 (64 y.o. [name] Primary Care [name]: SYSTEM, PCP Other Clinician: Referring Linnaea Ahn: Treating Beverley Allender/Extender: Valentino Saxon in Treatment: 2 Visit Information History Since Last Visit Added or deleted any medications: No Patient Arrived: Ambulatory Any new allergies or adverse reactions: No Arrival Time: 08:50 Had a fall or experienced change in No Accompanied By: self activities of daily living that may affect Transfer Assistance: None risk of falls: Patient Identification Verified: Yes Signs or symptoms of abuse/neglect since last visito No Secondary Verification Process Completed: Yes Hospitalized since last visit: No Patient Requires Transmission-Based Precautions: No Implantable device outside of the clinic excluding No Patient Has Alerts: Yes cellular tissue based products placed in the center since last visit: Has Dressing in Place as Prescribed: Yes Pain Present Now: Yes Electronic Signature(s) Signed: 05/17/2019 8:52:48 AM By: Karl Ito Entered By: Karl Ito on 05/17/2019 08:51:03 -------------------------------------------------------------------------------- Compression Therapy Details Patient Name: Date of Service: [name], [name] 05/17/2019 9:00 A M Medical Record Number: [id number] Patient Account Number: [id number] Date of Birth/Sex: Treating RN: [birth year] (64 y.o. [name] Primary Care Danira Nylander: SYSTEM, PCP Other Clinician: Referring Carynn Felling: Treating Elizibeth Breau/Extender: Valentino Saxon in Treatment: 2 Compression Therapy Performed for Wound Assessment: Wound #1 Right,Lateral Lower Leg Performed By: Clinician Yevonne Pax, RN Compression Type:  Three Layer Post Procedure Diagnosis Same as Pre-procedure Electronic Signature(s) Signed: 05/17/2019 5:53:12 PM By: Yevonne Pax RN Entered By: Yevonne Pax on 05/17/2019 09:21:38 -------------------------------------------------------------------------------- Encounter Discharge Information Details Patient Name: Date of Service: [name], [name] 05/17/2019 9:00 A M Medical Record Number: [id number] Patient Account Number: [id number] Date of Birth/Sex: Treating RN: 08/27/1955 (64 y.o. [name] Primary Care Florice Hindle: SYSTEM, PCP Other Clinician: Referring Rhen Kawecki: Treating Kameka Whan/Extender: Valentino Saxon in Treatment: 2 Encounter Discharge Information Items Discharge Condition: Stable Ambulatory Status: Ambulatory Discharge Destination: Home Transportation: Private Auto Accompanied By: self Schedule Follow-up Appointment: Yes Clinical Summary of Care: Patient Declined Electronic Signature(s) Signed: 05/17/2019 5:14:55 PM By: Cherylin Mylar Entered By: Cherylin Mylar on 05/17/2019 09:35:37 -------------------------------------------------------------------------------- Lower Extremity Assessment Details Patient Name: Date of Service: [name], [name] 05/17/2019 9:00 A M Medical Record Number: [id number] Patient Account Number: [id number] Date of Birth/Sex: Treating RN: 17-Apr-1955 (64 y.o. [name] Primary Care Rilee Wendling: SYSTEM, PCP Other Clinician: Referring Janese Radabaugh: Treating Mandel Seiden/Extender: Valentino Saxon in Treatment: 2 Edema Assessment Assessed: [Left: No] [Right: No] Edema: [Left: Ye] [Right: s] Calf Left: Right: Point of Measurement: 33 cm From Medial Instep cm 42.4 cm Ankle Left: Right: Point of Measurement: 7 cm From Medial Instep cm 26 cm Vascular Assessment Pulses: Dorsalis Pedis Palpable: [Right:Yes] Electronic Signature(s) Signed: 05/17/2019 5:10:43 PM By: Zenaida Deed RN, BSN Entered By: Zenaida Deed on 05/17/2019 09:07:57 -------------------------------------------------------------------------------- Multi Wound Chart Details Patient Name: Date of Service: [name], [name] 05/17/2019 9:00 A M Medical Record Number: [id number] Patient Account Number: [id number] Date of Birth/Sex: Treating RN: 1955/11/09 (64 y.o. [name] Primary Care Codie Krogh: SYSTEM, PCP Other Clinician: Referring Aayan Haskew: Treating Jalan Bodi/Extender: Valentino Saxon in Treatment: 2 Vital Signs Height(in): 69 Capillary Blood Glucose(mg/dl): 536 Weight(lbs): 644 Pulse(bpm): 71 Body Mass Index(BMI): 37 Blood Pressure(mmHg): Temperature(F): 97.8 Respiratory Rate(breaths/min): 18 Photos: [1:No Photos Right, Lateral Lower Leg] [N/A:N/A N/A] Wound Location: [1:Trauma] [N/A:N/A] Wounding Event: [1:Venous Leg Ulcer] [N/A:N/A] Primary  Etiology: [1:Diabetic Wound/Ulcer of the Lower] [N/A:N/A] Secondary Etiology: [1:Extremity Hypertension, Type II Diabetes,] [N/A:N/A] Comorbid History: [1:Neuropathy 10/03/2018] [N/A:N/A] Date Acquired: [1:2] [N/A:N/A] Weeks of Treatment: [1:Open] [N/A:N/A] Wound Status: [1:13x9.2x0.1] [N/A:N/A] Measurements L x W x D (cm) [1:93.934] [N/A:N/A] A (cm) : rea [1:9.393] [N/A:N/A] Volume (cm) : [1:10.70%] [N/A:N/A] % Reduction in Area: [1:55.30%] [N/A:N/A] % Reduction in Volume: [1:Full Thickness Without Exposed] [N/A:N/A] Classification: [1:Support Structures Large] [N/A:N/A] Exudate Amount: [1:Purulent] [N/A:N/A] Exudate Type: [1:yellow, brown, green] [N/A:N/A] Exudate Color: [1:Flat and Intact] [N/A:N/A] Wound Margin: [1:Small (1-33%)] [N/A:N/A] Granulation Amount: [1:Red] [N/A:N/A] Granulation Quality: [1:Large (67-100%)] [N/A:N/A] Necrotic Amount: [1:Fat Layer (Subcutaneous Tissue)] [N/A:N/A] Exposed Structures: [1:Exposed: Yes Fascia: No Tendon: No Muscle: No Joint: No Bone: No None] [N/A:N/A] Epithelialization: [1:Compression Therapy]  [N/A:N/A] Treatment Notes Electronic Signature(s) Signed: 05/17/2019 5:53:12 PM By: Carlene Coria RN Signed: 05/17/2019 6:13:26 PM By: Linton Ham MD Entered By: Linton Ham on 05/17/2019 09:25:48 -------------------------------------------------------------------------------- Multi-Disciplinary Care Plan Details Patient Name: Date of Service: [name], [name] 05/17/2019 9:00 A M Medical Record Number: [id number] Patient Account Number: [id number] Date of Birth/Sex: Treating RN: 1955/01/30 (64 y.o. [name] Primary Care Ashir Kunz: SYSTEM, PCP Other Clinician: Referring Dontavious Emily: Treating Kyrstyn Greear/Extender: Cheree Ditto in Treatment: 2 Active Inactive Nutrition Nursing Diagnoses: Potential for alteratiion in Nutrition/Potential for imbalanced nutrition Goals: Patient/caregiver will maintain therapeutic glucose control Date Initiated: 05/03/2019 Target Resolution Date: 06/03/2019 Goal Status: Active Interventions: Assess HgA1c results as ordered upon admission and as needed Assess patient nutrition upon admission and as needed per policy Notes: Wound/Skin Impairment Nursing Diagnoses: Knowledge deficit related to ulceration/compromised skin integrity Goals: Patient/caregiver will verbalize understanding of skin care regimen Date Initiated: 05/03/2019 Target Resolution Date: 06/03/2019 Goal Status: Active Ulcer/skin breakdown will have a volume reduction of 30% by week 4 Date Initiated: 05/03/2019 Target Resolution Date: 06/03/2019 Goal Status: Active Interventions: Assess patient/caregiver ability to obtain necessary supplies Assess patient/caregiver ability to perform ulcer/skin care regimen upon admission and as needed Assess ulceration(s) every visit Notes: Electronic Signature(s) Signed: 05/17/2019 5:53:12 PM By: Carlene Coria RN Entered By: Carlene Coria on 05/17/2019  08:53:26 -------------------------------------------------------------------------------- Pain Assessment Details Patient Name: Date of Service: [name], [name] 05/17/2019 9:00 [place] Record Number: [id number] Patient Account Number: [id number] Date of Birth/Sex: Treating RN: [birth year] (64 y.o. [name] Primary Care Mahreen Schewe: SYSTEM, PCP Other Clinician: Referring Tashanda Fuhrer: Treating Aarion Metzgar/Extender: Cheree Ditto in Treatment: 2 Active Problems Location of Pain Severity and Description of Pain Patient Has Paino Yes Site Locations Pain Location: Pain Location: Pain in Ulcers With Dressing Change: Yes Duration of the Pain. Constant / Intermittento Constant Rate the pain. Current Pain Level: 2 Worst Pain Level: 10 Least Pain Level: 2 Character of Pain Describe the Pain: Burning, Other: tingling Pain Management and Medication Current Pain Management: Medication: Yes Is the Current Pain Management Adequate: Adequate Rest: Yes How does your wound impact your activities of daily livingo Sleep: Yes Bathing: No Appetite: No Relationship With Others: No Bladder Continence: No Emotions: Yes Bowel Continence: No Work: No Toileting: No Drive: No Dressing: No Hobbies: Yes Electronic Signature(s) Signed: 05/17/2019 5:10:43 PM By: Baruch Gouty RN, BSN Signed: 05/17/2019 5:53:12 PM By: Carlene Coria RN Previous Signature: 05/17/2019 8:52:48 AM Version By: Sandre Kitty Entered By: Baruch Gouty on 05/17/2019 09:06:38 -------------------------------------------------------------------------------- Patient/Caregiver Education Details Patient Name: Date of Service: [name], [name] 5/11/2021andnbsp9:00 A M Medical Record Number: [id number] Patient Account Number: [id number] Date of Birth/Gender: Treating RN: 09/23/1955 (64 y.o. [name] Primary Care  Physician: SYSTEM, PCP Other Clinician: Referring Physician: Treating  Physician/Extender: Valentino Saxon in Treatment: 2 Education Assessment Education Provided To: Patient Education Topics Provided Wound/Skin Impairment: Methods: Explain/Verbal Responses: State content correctly Electronic Signature(s) Signed: 05/17/2019 5:53:12 PM By: Yevonne Pax RN Entered By: Yevonne Pax on 05/17/2019 08:53:42 -------------------------------------------------------------------------------- Wound Assessment Details Patient Name: Date of Service: [name], [name] 05/17/2019 9:00 A M Medical Record Number: [id number] Patient Account Number: [id number] Date of Birth/Sex: Treating RN: 07/09/1955 (64 y.o. [name] Primary Care Einer Meals: SYSTEM, PCP Other Clinician: Referring Antonina Deziel: Treating Laiya Wisby/Extender: Valentino Saxon in Treatment: 2 Wound Status Wound Number: 1 Primary Etiology: Venous Leg Ulcer Wound Location: Right, Lateral Lower Leg Secondary Etiology: Diabetic Wound/Ulcer of the Lower Extremity Wounding Event: Trauma Wound Status: Open Date Acquired: 10/03/2018 Comorbid History: Hypertension, Type II Diabetes, Neuropathy Weeks Of Treatment: 2 Clustered Wound: No Wound Measurements Length: (cm) 13 Width: (cm) 9.2 Depth: (cm) 0.1 Area: (cm) 93.934 Volume: (cm) 9.393 % Reduction in Area: 10.7% % Reduction in Volume: 55.3% Epithelialization: None Tunneling: No Undermining: No Wound Description Classification: Full Thickness Without Exposed Support Structures Wound Margin: Flat and Intact Exudate Amount: Large Exudate Type: Purulent Exudate Color: yellow, brown, green Foul Odor After Cleansing: No Slough/Fibrino Yes Wound Bed Granulation Amount: Small (1-33%) Exposed Structure Granulation Quality: Red Fascia Exposed: No Necrotic Amount: Large (67-100%) Fat Layer (Subcutaneous Tissue) Exposed: Yes Necrotic Quality: Adherent Slough Tendon Exposed: No Muscle Exposed: No Joint Exposed: No Bone Exposed:  No Treatment Notes Wound #1 (Right, Lateral Lower Leg) 1. Cleanse With Wound Cleanser Soap and water 2. Periwound Care Moisturizing lotion 3. Primary Dressing Applied Hydrogel or K-Y Jelly Other primary dressing (specifiy in notes) 4. Secondary Dressing ABD Pad Dry Gauze 6. Support Layer Applied 3 layer compression wrap Notes netting Electronic Signature(s) Signed: 05/17/2019 5:10:43 PM By: Zenaida Deed RN, BSN Entered By: Zenaida Deed on 05/17/2019 09:12:08 -------------------------------------------------------------------------------- Vitals Details Patient Name: Date of Service: [name], [name] 05/17/2019 9:00 A M Medical Record Number: [id number] Patient Account Number: [id number] Date of Birth/Sex: Treating RN: 04/14/1955 (64 y.o. [name] Primary Care Jearline Hirschhorn: SYSTEM, PCP Other Clinician: Referring Kylinn Shropshire: Treating Inez Rosato/Extender: Valentino Saxon in Treatment: 2 Vital Signs Time Taken: 08:55 Temperature (F): 97.8 Height (in): 69 Pulse (bpm): 71 Weight (lbs): 249 Respiratory Rate (breaths/min): 18 Body Mass Index (BMI): 36.8 Capillary Blood Glucose (mg/dl): 875 Reference Range: 80 - 120 mg / dl Electronic Signature(s) Signed: 05/18/2019 9:08:01 AM By: Karl Ito Entered By: Karl Ito on 05/17/2019 08:55:11

## 2019-05-24 ENCOUNTER — Encounter (HOSPITAL_BASED_OUTPATIENT_CLINIC_OR_DEPARTMENT_OTHER): Payer: Self-pay | Admitting: Internal Medicine

## 2019-05-30 ENCOUNTER — Encounter (HOSPITAL_BASED_OUTPATIENT_CLINIC_OR_DEPARTMENT_OTHER): Payer: Self-pay | Admitting: Internal Medicine

## 2022-01-17 IMAGING — CT CT MAXILLOFACIAL W/O CM
3 series · 16 of 47 positions shown, 19 images · non-contrast
Comparison: None.

CLINICAL DATA: Fall, hit nose

EXAM:
CT MAXILLOFACIAL WITHOUT CONTRAST
TECHNIQUE: Multidetector CT imaging of the maxillofacial structures was
performed. Multiplanar CT image reconstructions were also generated.

[Series 2: max soft · axial · 0.37mm/px · z∈[+1156,+1332]mm · 10 of 102 slices shown, 13 images]
[im 7/102  brain]
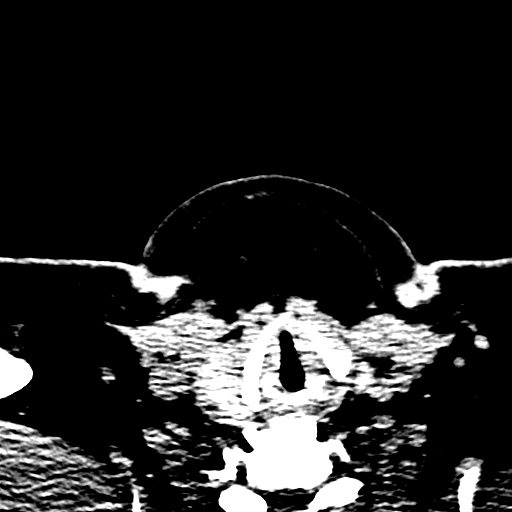
[im 7/102  bone]
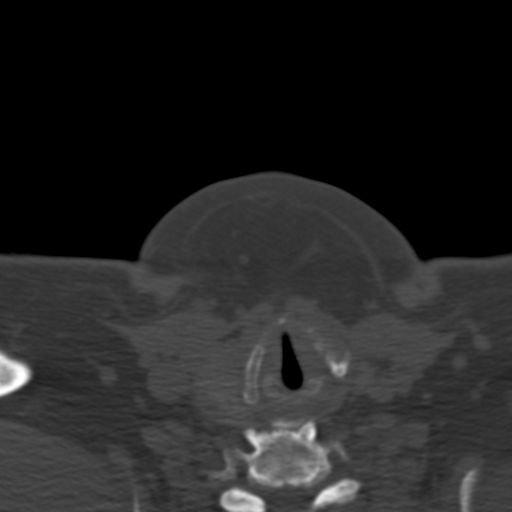
[im 18/102  bone]
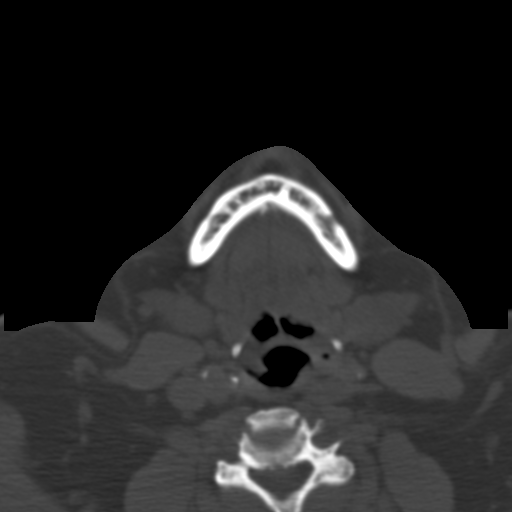
[im 28/102  bone]
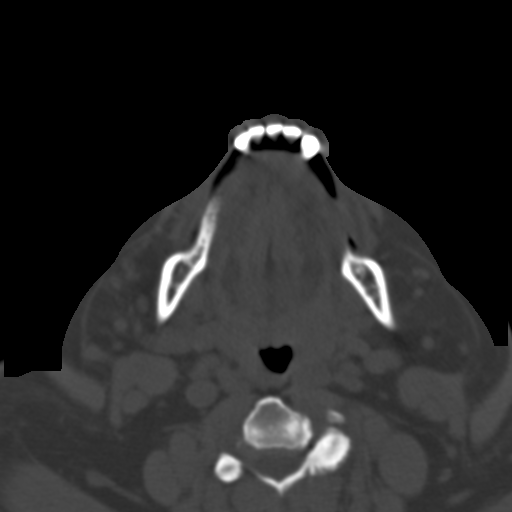
[im 35/102  bone]
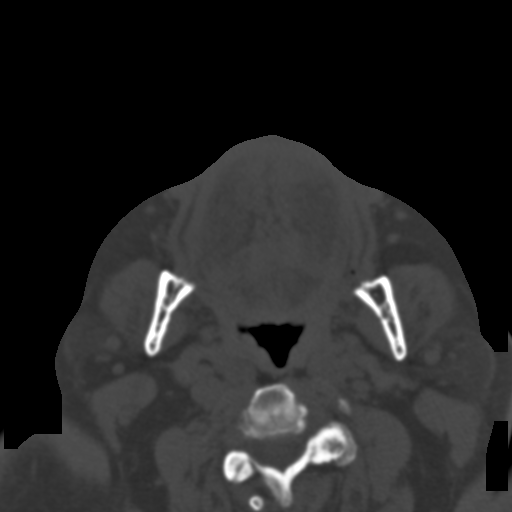
[im 46/102  brain]
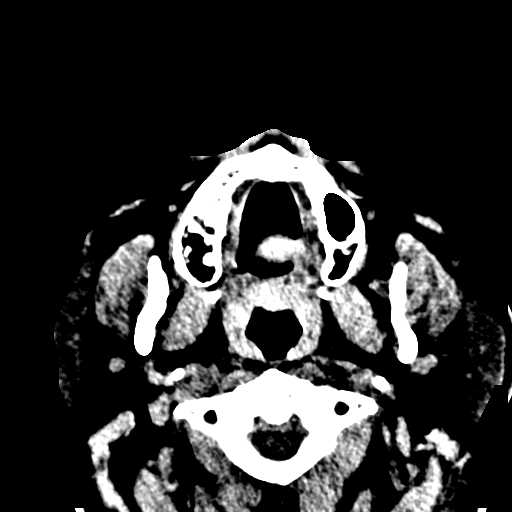
[im 46/102  bone]
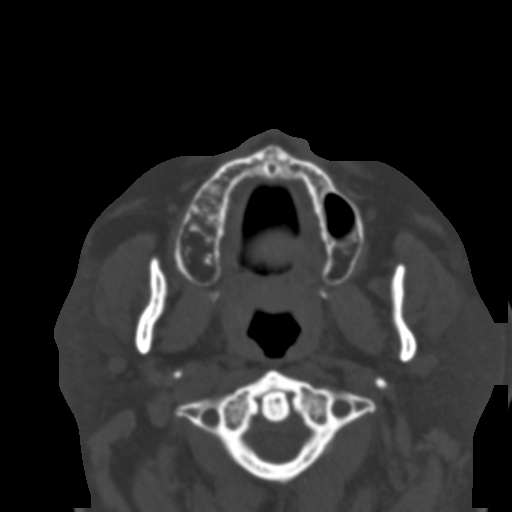
[im 56/102  bone]
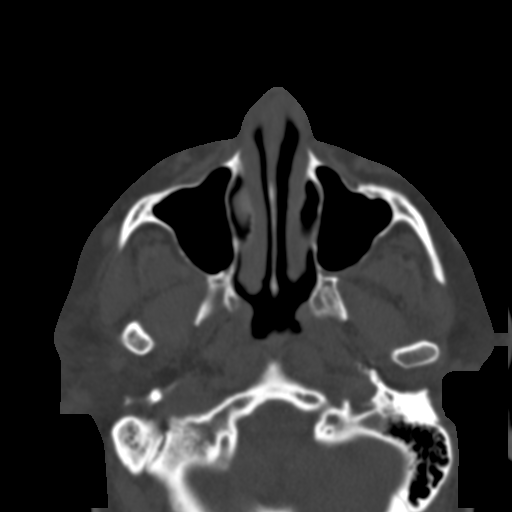
[im 67/102  bone]
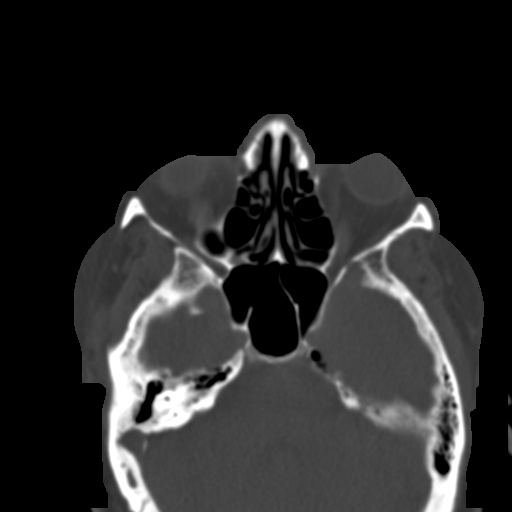
[im 77/102  bone]
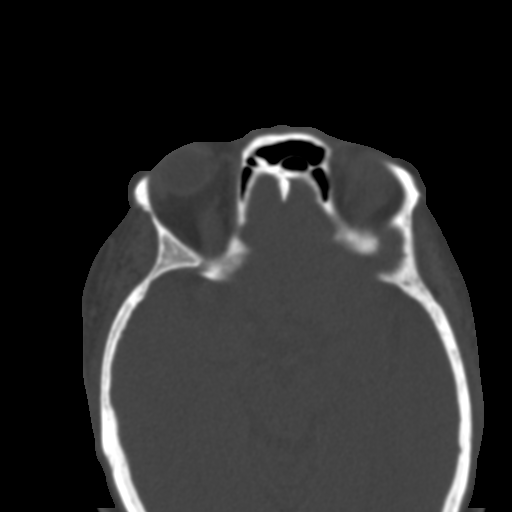
[im 84/102  brain]
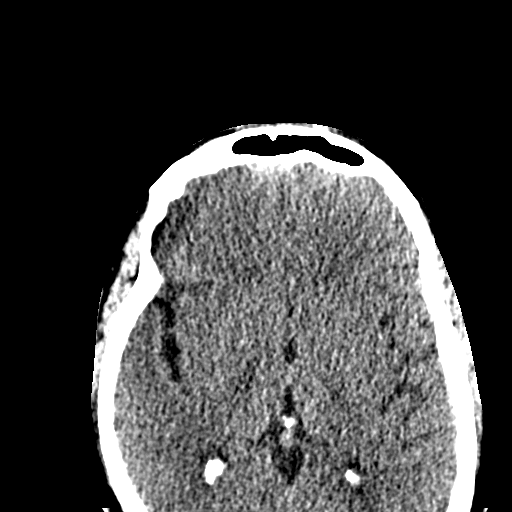
[im 84/102  bone]
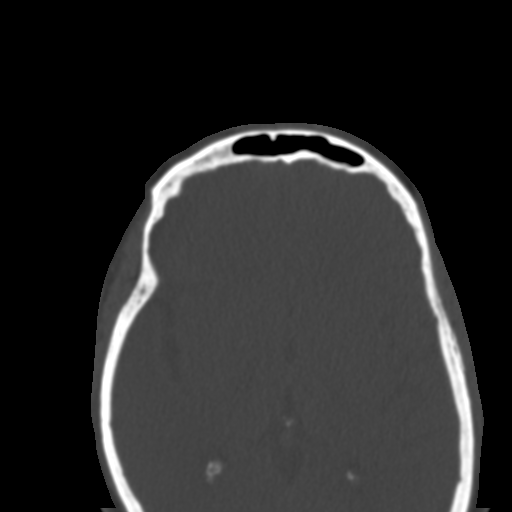
[im 95/102  bone]
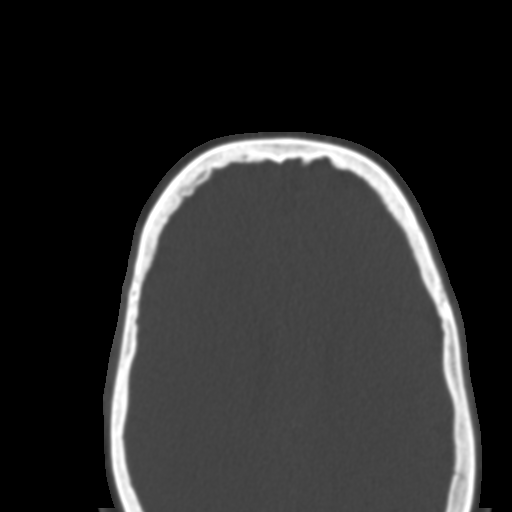

[Series 6: coronal soft · coronal · 0.41mm/px · 3 of 83 slices shown]
[im 28/83  bone]
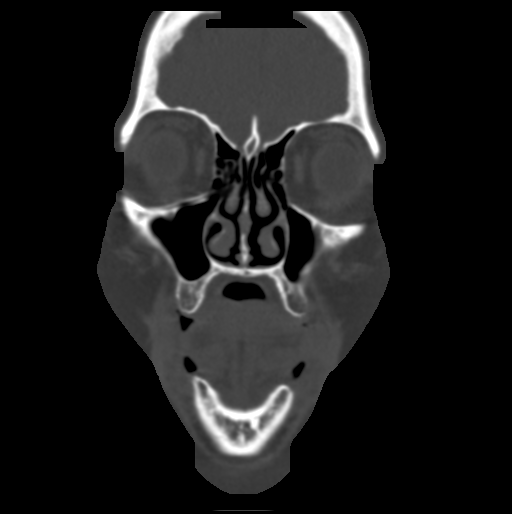
[im 37/83  bone]
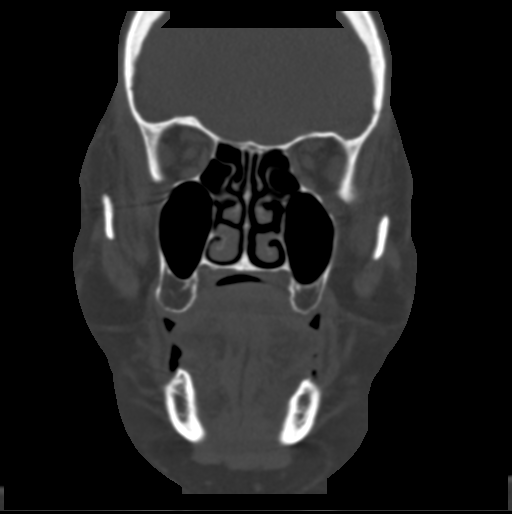
[im 46/83  bone]
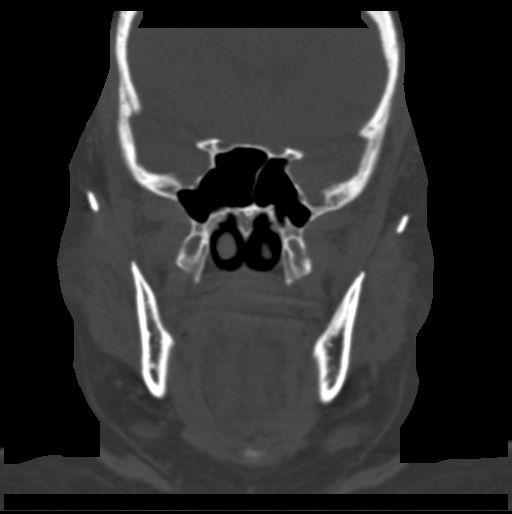

[Series 7: sagittal soft · sagittal · 0.35mm/px · 3 of 100 slices shown]
[im 34/100  bone]
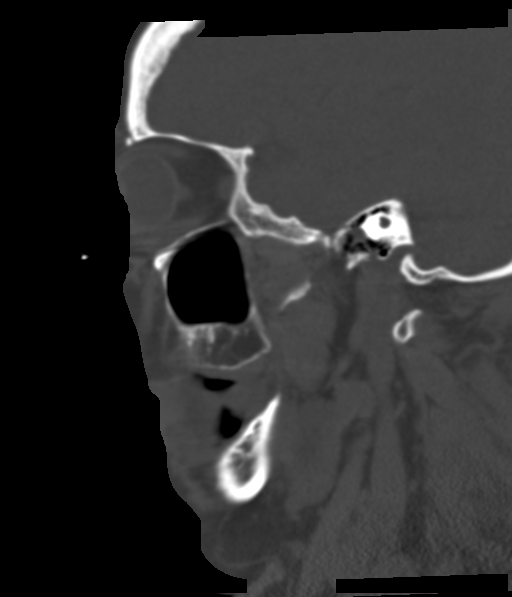
[im 50/100  bone]
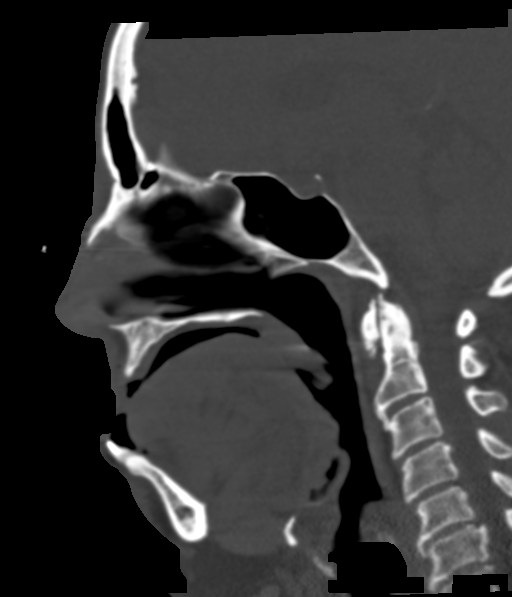
[im 67/100  bone]
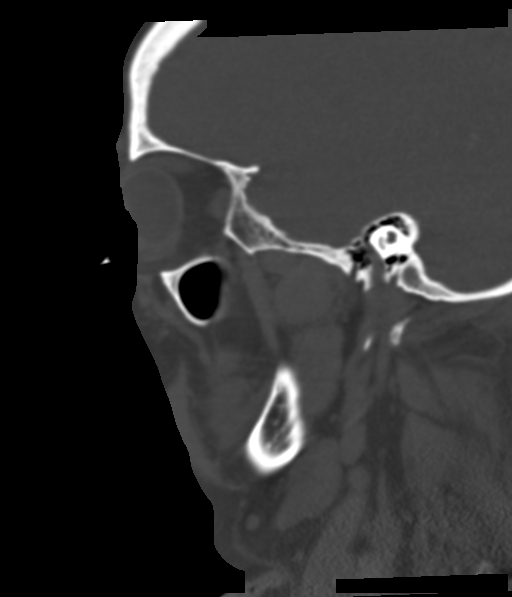

[16 of 47 positions shown; findings below may reference images not displayed]

FINDINGS: Osseous: No fracture or mandibular dislocation. No destructive
process.

Orbits: No orbital fracture.  Globes are intact.

Sinuses: Clear

Soft tissues: No acute findings

Limited intracranial: No acute findings
IMPRESSION: No facial or orbital fracture.
